# Patient Record
Sex: Male | Born: 1975 | Race: White | Hispanic: No | Marital: Single | Smoking: Former smoker
Health system: Southern US, Community
[De-identification: ages and names within clinical notes are randomized; demographics above are authoritative.]

## PROBLEM LIST (undated history)

## (undated) DIAGNOSIS — Z8619 Personal history of other infectious and parasitic diseases: Secondary | ICD-10-CM

## (undated) DIAGNOSIS — Z87442 Personal history of urinary calculi: Secondary | ICD-10-CM

## (undated) HISTORY — DX: Personal history of other infectious and parasitic diseases: Z86.19

## (undated) HISTORY — DX: Personal history of urinary calculi: Z87.442

## (undated) HISTORY — PX: TONSILLECTOMY: SHX5217

---

## 2010-05-19 ENCOUNTER — Emergency Department (INDEPENDENT_AMBULATORY_CARE_PROVIDER_SITE_OTHER)
Admission: EM | Admit: 2010-05-19 | Discharge: 2010-05-19 | Payer: PRIVATE HEALTH INSURANCE | Source: Home / Self Care | Admitting: Emergency Medicine

## 2010-05-19 DIAGNOSIS — M549 Dorsalgia, unspecified: Secondary | ICD-10-CM

## 2010-05-19 DIAGNOSIS — Z87442 Personal history of urinary calculi: Secondary | ICD-10-CM

## 2010-05-19 DIAGNOSIS — N509 Disorder of male genital organs, unspecified: Secondary | ICD-10-CM

## 2010-05-19 LAB — URINALYSIS, ROUTINE W REFLEX MICROSCOPIC
Nitrite: NEGATIVE
Urine Glucose, Fasting: NEGATIVE mg/dL
pH: 6 (ref 5.0–8.0)

## 2010-05-26 ENCOUNTER — Encounter: Payer: Self-pay | Admitting: Family

## 2010-05-26 ENCOUNTER — Ambulatory Visit (INDEPENDENT_AMBULATORY_CARE_PROVIDER_SITE_OTHER): Payer: PRIVATE HEALTH INSURANCE | Admitting: Family

## 2010-05-26 ENCOUNTER — Telehealth: Payer: Self-pay | Admitting: Family

## 2010-05-26 DIAGNOSIS — N410 Acute prostatitis: Secondary | ICD-10-CM

## 2010-05-26 DIAGNOSIS — R109 Unspecified abdominal pain: Secondary | ICD-10-CM

## 2010-05-26 LAB — CONVERTED CEMR LAB
Blood in Urine, dipstick: NEGATIVE
Glucose, Urine, Semiquant: NEGATIVE
Ketones, urine, test strip: NEGATIVE
Specific Gravity, Urine: 1.01
WBC Urine, dipstick: NEGATIVE
pH: 7.5

## 2010-05-27 ENCOUNTER — Encounter: Payer: Self-pay | Admitting: Family

## 2010-05-27 LAB — CONVERTED CEMR LAB
Chlamydia, Swab/Urine, PCR: NEGATIVE
GC Probe Amp, Urine: NEGATIVE

## 2010-06-04 NOTE — Progress Notes (Signed)
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 Wants to know if he can get a refill on Oxycodone (has taken all that the ED provided) or something for pain? Has taken 1 Mobic today and doesn't feel much relief in his pain.  Please advise.  Initial call taken by: Mervin Kung CMA Duncan Dull),  May 26, 2010 3:32 PM  Follow-up for Phone Call        No refill on oxycodone.  At this time, I believe that his symptoms are either due to prostatitis or low back strain.  Narcotics are not recommended in the treatment of either of these.  He should continue Mobic.  If no improvement in the next few days, he should let us know and we will  complete a CT.   Follow-up by: Lemont Fillers FNP,  May 26, 2010 3:40 PM  Additional Follow-up for Phone Call Additional follow up Details #1::        Pt advised. Additional Follow-up by: Mervin Kung CMA Duncan Dull),  May 26, 2010 4:13 PM

## 2010-06-04 NOTE — Miscellaneous (Signed)
  Clinical Lists Changes  Medications: Removed medication of IBUPROFEN 800 MG TABS (IBUPROFEN) Take 2 tablets every 6 hours as needed.

## 2010-06-04 NOTE — Letter (Signed)
   [place] at [facility] [address] [place], [place]  [address]  [place] Phone: [phone]      May 27, 2010   [name] [address] [place], [place] [address]  RE:  LAB RESULTS  Dear  [name],  The following is an interpretation of your most recent lab tests.  Please take note of any instructions provided or changes to medications that have resulted from your lab work. Your PSA (prostate test) is normal.     Sincerely Yours,    Lemont Fillers FNP  Appended Document:  mailed

## 2010-06-04 NOTE — Assessment & Plan Note (Signed)
Summary: new to est/kidney stones/medcost/seen in ed/ss--rm 5   Vital Signs:  Patient profile:   35 year old male Height:      67.75 inches Weight:      199.75 pounds BMI:     30.71 Temp:     98.4 degrees F oral Pulse rate:   78 / minute Pulse rhythm:   regular Resp:     16 per minute BP sitting:   110 / 76  (right arm) Cuff size:   large  Vitals Entered By: Mervin Kung CMA Duncan Dull) (May 26, 2010 10:05 AM) CC: Pt states he has had pain in left lower back and left testacle x 1 week. Was seen in ED and told he may have a stone. Pt thinks he has pulled a muscle. Is Patient Diabetic? No Pain Assessment Patient in pain? yes     Location: left lower back   CC:  Pt states he has had pain in left lower back and left testacle x 1 week. Was seen in ED and told he may have a stone. Pt thinks he has pulled a muscle.Marland Kitchen  History of Present Illness: [name] is a 35 year old male who presents today with complaint of  left lower back pain which started a week ago Sunday.  Pain radiates into his left testicle.    Pain is worsened by nothing. Pain is improved by certain positions.   Pain is described as a discomfort and is moderate in severity.   Pain is associated with radiation into the left testicle. Pain is not associated with fever or hematuria.  Feels that this is different from the pain that he has experienced previously with kidney stones.     Hurts to bend over or to to put socks on.  "Feels like I am sitting on my testicle." Denies problems with erectile dysfunction.  Patient was seen in the ED on 1/31 for same and underwent a scotal ultrasound, and UA.   Preventive Screening-Counseling & Management  Alcohol-Tobacco     Alcohol drinks/day: on weekends     Alcohol type: beer, liquor     Smoking Status: quit     Year Quit: 2006     Pack years: 12  Caffeine-Diet-Exercise     Caffeine use/day: drinks tea all day, 1 energy drink daily     Does Patient Exercise: no   Drug Use:  no.    Allergies (verified): 1)  ! Pcn  Past History:  Past Medical History: Hx of chicken pox Hx of kidney stone  Past Surgical History: tonsillectomy as a child  Family History: Reviewed history and no changes required. Mother-- living; lymph nodes removed from breast, uterine tumor? Father-- living- alive and well PGF--deceased; pneumonia, lung cancer PGM-- living; stroke, cardiac stent ? valve replacement 2 sisters-- a & w No children  Social History: Reviewed history and no changes required. Occupation: truck Hospital doctor  Single Tobacco--quit 2006 Alcohol use-yes- weekends few glasses beer or liquor Drug use-no; quit 2009 previous use of marijuana pain pills cocaine.  Regular exercise-no Smoking Status:  quit Caffeine use/day:  drinks tea all day, 1 energy drink daily Does Patient Exercise:  no Drug Use:  no  Review of Systems       Constitutional: Denies Fever ENT: mild nasal congestion, denies sore throat. Resp: mild cough associated with sinus drainge CV:  Denies Chest Pain or shortness of breath GI:  Denies nausea or vomitting, chronic diarrhea GU: Denies dysuria, denies hematuria Lymphatic:  Denies lymphadenopathy Musculoskeletal:  Denies muscle/joint pain, denies recent back injury Skin:  Denies Rashes has mole on back Psychiatric: Denies depression or anxiety Neuro: Denies numbness or weakness.     Physical Exam  General:  Well-developed,well-nourished,in no acute distress; alert,appropriate and cooperative throughout examination Head:  Normocephalic and atraumatic without obvious abnormalities. No apparent alopecia or balding. Eyes:  PERRLA, sclera are clear Ears:  External ear exam shows no significant lesions or deformities.  Otoscopic examination reveals clear canals, tympanic membranes are intact bilaterally without bulging, retraction, inflammation or discharge. Hearing is grossly normal bilaterally. Mouth:  Oral mucosa and oropharynx  without lesions or exudates.  Teeth in good repair. Neck:  No deformities, masses, or tenderness noted. No thyroid enlargement Lungs:  Normal respiratory effort, chest expands symmetrically. Lungs are clear to auscultation, no crackles or wheezes. Heart:  Normal rate and regular rhythm. S1 and S2 normal without gallop, murmur, click, rub or other extra sounds. Abdomen:  Bowel sounds positive,abdomen soft and non-tender without masses, organomegaly or hernias noted. Rectal:  Prominent prostate.  Non-tender to palpation, smooth.no external abnormalities and no hemorrhoids.   Genitalia:  No penis lesions or urethral discharge. No palpable inguinal hernias on exam.circumcised.   Prostate:  Prominent prostate, ? slightly enlarged. Non-tender to palpation.  Smooth and symmetric without palpable nodules.  Msk:  normal ROM, no joint tenderness, and no joint swelling.   Extremities:  No peripheral edema Neurologic:  bilateral LE strength is 5/5alert & oriented X3, gait normal, and DTRs symmetrical and normal.   Psych:  Cognition and judgment appear intact. Alert and cooperative with normal attention span and concentration. No apparent delusions, illusions, hallucinations   Detailed Back/Spine Exam  Lumbosacral Exam:  Inspection-deformity:    Normal Palpation-spinal tenderness:  Normal Lying Straight Leg Raise:    Right:  negative    Left:  negative   Impression & Recommendations:  Problem # 1:  GROIN PAIN (ICD-789.09) Assessment New ED records were reviewed.  Differential includes Acute prostatitis, inguinal hernia, musculoskeletal strain and kidney stone.  No palpable inguinal hernia on exam today and no blood in urinalysis which makes kidney stone and hernia less likely.  Prostate seems slightly enlarged for age.  Will plan to treat empirically for prostatitis with cipro.(Doxycycline rx was cancelled at pharmacy)  Pt will also be given mobic as needed for pain.  Send urine for GC/Clamydia and  culture.  Follow up in 2 weeks.      His updated medication list for this problem includes:    Oxycodone-acetaminophen 5-500 Mg Caps (Oxycodone-acetaminophen) .Marland Kitchen... 2 tablets every 5 hours as needed.    Ibuprofen 800 Mg Tabs (Ibuprofen) .Marland Kitchen... Take 2 tablets every 6 hours as needed.    Mobic 7.5 Mg Tabs (Meloxicam) ..... One tablet by mouth daily as needed for back pain  Complete Medication List: 1)  Oxycodone-acetaminophen 5-500 Mg Caps (Oxycodone-acetaminophen) .... 2 tablets every 5 hours as needed. 2)  Ibuprofen 800 Mg Tabs (Ibuprofen) .... Take 2 tablets every 6 hours as needed. 3)  Cipro 500 Mg Tabs (Ciprofloxacin hcl) .... One tablet by mouth two times a day for 14 days 4)  Mobic 7.5 Mg Tabs (Meloxicam) .... One tablet by mouth daily as needed for back pain  Other Orders: T-PSA ([id number]) UA Dipstick w/o Micro (manual) (14782) T-Culture, Urine ([id number]) T-Chlamydia & GC Probe, Urine (87491/[id number]) Specimen Handling (99000)  Patient Instructions: 1)  Call if you develop fever, worsening low back pain, nausea, vomitting. 2)  Follow up in  2 weeks. Prescriptions: MOBIC 7.5 MG TABS (MELOXICAM) one tablet by mouth daily as needed for back pain  #20 x 0   Entered and Authorized by:   Lemont Fillers FNP   Signed by:   Lemont Fillers FNP on 05/26/2010   Method used:   Electronically to        [facility]  [address] [phone]* (retail)       [address]       [place], [place]  [address]       Ph: [phone] or [phone]       Fax: [phone]   RxID:   [id number] CIPRO 500 MG TABS (CIPROFLOXACIN HCL) one tablet by mouth two times a day for 14 days  #28 x 0   Entered and Authorized by:   Lemont Fillers FNP   Signed by:   Lemont Fillers FNP on 05/26/2010   Method used:   Electronically to        [facility]  [address] [phone]* (retail)       [address]       [place], [place]  [address]       Ph:  [phone] or [phone]       Fax: [phone]   RxID:   [id number] DOXYCYCLINE HYCLATE 100 MG CAPS (DOXYCYCLINE HYCLATE) one tablet by mouth two times a day for 10 days  #20 x 0   Entered and Authorized by:   Lemont Fillers FNP   Signed by:   Lemont Fillers FNP on 05/26/2010   Method used:   [facility]        [facility]  [address] [phone]* (retail)       [address]       [place], [place]  [address]       Ph: [phone] or [phone]       Fax: [phone]   RxID:   [id number]    Orders Added: 1)  T-PSA [id number] 2)  UA Dipstick w/o Micro (manual) [81002] 3)  T-Culture, Urine [[id number]] 4)  T-Chlamydia & GC Probe, Urine [87491/[id number]] 5)  Specimen Handling [99000] 6)  New Patient Level IV [34193]     Preventive Care Screening  Last Tetanus Booster:    Date:  04/20/2007    Results:  history    Current Allergies (reviewed today): ! PCN  Laboratory Results   Urine Tests   Date/Time Reported: Mervin Kung CMA Duncan Dull)  May 26, 2010 10:48 AM   Routine Urinalysis   Color: yellow Appearance: Clear Glucose: negative   (Normal Range: Negative) Bilirubin: negative   (Normal Range: Negative) Ketone: negative   (Normal Range: Negative) Spec. Gravity: 1.010   (Normal Range: 1.003-1.035) Blood: negative   (Normal Range: Negative) pH: 7.5   (Normal Range: 5.0-8.0) Protein: negative   (Normal Range: Negative) Urobilinogen: 0.2   (Normal Range: 0-1) Nitrite: negative   (Normal Range: Negative) Leukocyte Esterace: negative   (Normal Range: Negative)

## 2010-06-09 ENCOUNTER — Ambulatory Visit (INDEPENDENT_AMBULATORY_CARE_PROVIDER_SITE_OTHER): Payer: PRIVATE HEALTH INSURANCE | Admitting: Internal Medicine

## 2010-06-09 ENCOUNTER — Encounter: Payer: Self-pay | Admitting: Family

## 2010-06-09 DIAGNOSIS — R109 Unspecified abdominal pain: Secondary | ICD-10-CM

## 2010-06-16 NOTE — Assessment & Plan Note (Signed)
Summary: 2 wk follow up/ss   Vital Signs:  Patient profile:   35 year old male Height:      67.5 inches Weight:      201.50 pounds BMI:     31.21 O2 Sat:      98 % on Room air Temp:     98.3 degrees F oral Pulse rate:   78 / minute Resp:     20 per minute BP sitting:   100 / 66  (right arm) Cuff size:   large  Vitals Entered By: Glendell Docker CMA (June 09, 2010 8:54 AM)  O2 Flow:  Room air CC: 2 week follow up  Is Patient Diabetic? No Pain Assessment Patient in pain? no      Comments symptoms resolved, concerned with exposure to illness and would like to know if current antibiotics will help with that   CC:  2 week follow up .  History of Present Illness: [name] is a 35 year old male who presents today for follow up of his groin pain.  Since last visit he has continued the cipro for suspected prostatitis.  Notes that he had some mild pain on saturday, but that pain has completely resolved at this time.    Notes that he has been around several people with colds.  He himself denies associate URI symptoms.    Preventive Screening-Counseling & Management  Alcohol-Tobacco     Smoking Status: quit  Allergies: 1)  ! Pcn  Family History: Mother-- living; lymph nodes removed from breast, uterine tumor? Father-- living- alive and well PGF--deceased; pneumonia, lung cancer PGM-- living; stroke, cardiac stent ? valve replacement 2 sisters-- a & w No children Maternal GF- prostate cancer  Review of Systems       see HPI  Physical Exam  General:  Well-developed,well-nourished,in no acute distress; alert,appropriate and cooperative throughout examination Head:  + scar on top of head from childhood accident Ears:  + earings Lungs:  Normal respiratory effort, chest expands symmetrically. Lungs are clear to auscultation, no crackles or wheezes. Heart:  Normal rate and regular rhythm. S1 and S2 normal without gallop, murmur, click, rub or other extra  sounds. Psych:  Cognition and judgment appear intact. Alert and cooperative with normal attention span and concentration. No apparent delusions, illusions, hallucinations   Impression & Recommendations:  Problem # 1:  GROIN PAIN (ICD-789.09) Assessment Improved Symptoms are resolved.  Suspect that symptoms were due to prostatits.  Pt instructed to complete cipro rx.  Call if recurrent symptoms. The following medications were removed from the medication list:    Oxycodone-acetaminophen 5-500 Mg Caps (Oxycodone-acetaminophen) .Marland Kitchen... 2 tablets every 5 hours as needed. His updated medication list for this problem includes:    Mobic 7.5 Mg Tabs (Meloxicam) ..... One tablet by mouth daily as needed for back pain  Complete Medication List: 1)  Cipro 500 Mg Tabs (Ciprofloxacin hcl) .... One tablet by mouth two times a day for 14 days 2)  Mobic 7.5 Mg Tabs (Meloxicam) .... One tablet by mouth daily as needed for back pain  Patient Instructions: 1)  Please schedule a complete physical. 2)  Come fasting to this appointment.   Orders Added: 1)  Est. Patient Level III [54098]   Immunization History:  Influenza Immunization History:    Influenza:  declined (06/09/2010)   Immunization History:  Influenza Immunization History:    Influenza:  Declined (06/09/2010)  Current Allergies (reviewed today): ! PCN

## 2010-06-22 ENCOUNTER — Encounter: Payer: Self-pay | Admitting: Family

## 2010-06-22 LAB — CONVERTED CEMR LAB
ALT: 44 units/L (ref 0–53)
AST: 24 units/L (ref 0–37)
Albumin: 4.4 g/dL (ref 3.5–5.2)
Alkaline Phosphatase: 65 units/L (ref 39–117)
Basophils Absolute: 0.1 10*3/uL (ref 0.0–0.1)
Basophils Relative: 1 % (ref 0–1)
CO2: 24 meq/L (ref 19–32)
Calcium: 9.5 mg/dL (ref 8.4–10.5)
Cholesterol: 180 mg/dL (ref 0–200)
Creatinine, Ser: 1 mg/dL (ref 0.40–1.50)
Eosinophils Relative: 5 % (ref 0–5)
HCT: 46.2 % (ref 39.0–52.0)
HDL: 46 mg/dL (ref >39)
Lymphocytes Relative: 23 % (ref 12–46)
MCHC: 34.4 g/dL (ref 30.0–36.0)
Monocytes Absolute: 0.4 10*3/uL (ref 0.1–1.0)
Platelets: 179 10*3/uL (ref 150–400)
RDW: 13.1 % (ref 11.5–15.5)
Sodium: 140 meq/L (ref 135–145)
TSH: 1.35 microintl units/mL (ref 0.350–4.500)
Total Bilirubin: 0.8 mg/dL (ref 0.3–1.2)
Total CHOL/HDL Ratio: 3.9
Total Protein: 6.7 g/dL (ref 6.0–8.3)
Triglycerides: 104 mg/dL (ref <150)

## 2010-06-29 ENCOUNTER — Encounter: Payer: Self-pay | Admitting: Family

## 2010-06-29 ENCOUNTER — Encounter (INDEPENDENT_AMBULATORY_CARE_PROVIDER_SITE_OTHER): Payer: PRIVATE HEALTH INSURANCE | Admitting: Family

## 2010-06-29 DIAGNOSIS — Z0000 Encounter for general adult medical examination without abnormal findings: Secondary | ICD-10-CM

## 2010-06-29 DIAGNOSIS — R7309 Other abnormal glucose: Secondary | ICD-10-CM

## 2010-06-30 NOTE — Miscellaneous (Signed)
Summary: Orders Update  Clinical Lists Changes  Orders: Added new Test order of T-Basic Metabolic Panel ([id number]) - Signed Added new Test order of T-Hepatic Function (80076-22960) - Signed Added new Test order of T-Lipid Profile ([id number]) - Signed Added new Test order of T-TSH ([id number]) - Signed Added new Test order of T-CBC w/Diff (85025-10010) - Signed 

## 2010-07-07 ENCOUNTER — Other Ambulatory Visit: Payer: Self-pay | Admitting: Family

## 2010-07-07 NOTE — Assessment & Plan Note (Signed)
Summary: CPE/MHF--rm 5   Vital Signs:  Patient profile:   35 year old male Height:      67.5 inches Weight:      207.50 pounds BMI:     32.14 Temp:     98.1 degrees F oral Pulse rate:   78 / minute Pulse rhythm:   regular Resp:     18 per minute BP sitting:   118 / 76  (right arm) Cuff size:   large  Vitals Entered By: Mervin Kung CMA Duncan Dull) (June 29, 2010 9:04 AM) Is Patient Diabetic? No Pain Assessment Patient in pain? no      Comments Pt has completed Mobic x 2 weeks; pain is resolved. Pt completed Cipro. Nicki Guadalajara Fergerson CMA Duncan Dull)  June 29, 2010 9:09 AM    History of Present Illness: Mr.  [name] is a 35 year old male who presents today for CPX.  Preventative-  Not exercising regularly.  Diet-  eats a lot of fast food.  Drinks 2 sweet teas with each meal and "a couple of energy drinks each day."  Notes + fatigue, though thinks that he sleeps well at night.  Preventive Screening-Counseling & Management  Alcohol-Tobacco     Alcohol drinks/day: on weekends     Alcohol type: beer, liquor     Smoking Status: quit     Year Quit: 2006     Pack years: 12  Caffeine-Diet-Exercise     Caffeine use/day: drinks tea all day, 1 energy drink daily     Does Patient Exercise: no  Allergies: 1)  ! Pcn  Past History:  Past Medical History: Last updated: 05/26/2010 Hx of chicken pox Hx of kidney stone  Past Surgical History: Last updated: 05/26/2010 tonsillectomy as a child  Family History: Mother-- living; lymph nodes removed from breast, uterine tumor? Father-- living- alive and well PGF--deceased; pneumonia, lung cancer PGM-- living; stroke, cardiac stent ? valve replacement MGF--prostate cancer- diagnosed in his 50's 2 sisters-- a & w No children Maternal GF- prostate cancer  Review of Systems       Constitutional: Denies Fever ENT:  Denies nasal congestion or sore throat. Resp: Denies cough CV:  Denies Chest Pain or shortness of breath GI:  Denies  nausea or vomitting GU: Denies dysuria Lymphatic: Denies lymphadenopathy Musculoskeletal:  Denies muscle/joint pain Skin:  Denies Rashes,  mild sunburn on head Psychiatric: Denies depression or anxiety Neuro: Denies numbness     Physical Exam  General:  Well-developed,well-nourished,in no acute distress; alert,appropriate and cooperative throughout examination Head:  bald, scar noted on scalp. Eyes:  PERRLA, sclera clear Ears:  External ear exam shows no significant lesions or deformities.  Otoscopic examination reveals clear canals, tympanic membranes are intact bilaterally without bulging, retraction, inflammation or discharge. Hearing is grossly normal bilaterally.ear piercing(s) noted.   Mouth:  Oral mucosa and oropharynx without lesions or exudates.   Neck:  No deformities, masses, or tenderness noted. Chest Wall:  No deformities, masses, tenderness or gynecomastia noted. Lungs:  Normal respiratory effort, chest expands symmetrically. Lungs are clear to auscultation, no crackles or wheezes. Heart:  Normal rate and regular rhythm. S1 and S2 normal without gallop, murmur, click, rub or other extra sounds. Abdomen:  Bowel sounds positive,abdomen soft and non-tender without masses, organomegaly or hernias noted. Genitalia:  deferred- completed on recent visit. Msk:  No deformity or scoliosis noted of thoracic or lumbar spine.   Pulses:  2+ DP/PT pulses bilaterally Extremities:  No clubbing, cyanosis, edema, or deformity noted with  normal full range of motion of all joints.   Neurologic:  No cranial nerve deficits noted. Station and gait are normal. Plantar reflexes are down-going bilaterally. DTRs are symmetrical throughout. Sensory, motor and coordinative functions appear intact. Skin:  Intact without suspicious lesions or rashes Cervical Nodes:  No lymphadenopathy noted Psych:  Cognition and judgment appear intact. Alert and cooperative with normal attention span and concentration.  No apparent delusions, illusions, hallucinations   Impression & Recommendations:  Problem # 1:  PREVENTIVE HEALTH CARE (ICD-V70.0) Assessment Comment Only Patient was counseled on healthy eating, diet, exercise and weight loss.  Specifically instructed patient to eliminate sugared beverages (sweet tea/energy drinks) and substitute water.  Cut caffeine down to no more than 2 caffeinated beverages/day.  Increase fresh fruits/veggies, exercise.  Suspect that his fatigue is related to all of his sugar/caffeine consumption.  Pt to f/u in 3 months, sooner if symptoms worsen.  Labs reviewed,  no anemia or hypothyroidism.    Problem # 2:  HYPERGLYCEMIA, FASTING (ICD-790.29) Assessment: New Mild fasting hyperglycemia.  Will check A1C.   Orders: T-Hgb A1C (40981-19147)  Patient Instructions: 1)  Please complete your lab work on the first floor.   2)  Try to eliminate sugar drinks and cut your caffeine back to no more than 2 beverages a day. 3)  Eat more fresh fruits and veggies. 4)  It is important that you exercise regularly at least 20 minutes 5 times a week. If you develop chest pain, have severe difficulty breathing, or feel very tired , stop exercising immediately and seek medical attention. 5)  Please schedule a follow-up appointment in 3 months.   Orders Added: 1)  T-Hgb A1C [83036-23375] 2)  Est. Patient 18-39 years [99395] 3)  Est. Patient Level III [82956]    Current Allergies (reviewed today): ! PCN   Vital Signs:  Patient Profile:   35 year old male Height:     67.5 inches Weight:      207.50 pounds BMI:     32.14 Temp:     98.1 degrees F oral Pulse rate:   78 / minute Pulse rhythm:   regular Resp:     18 per minute BP sitting:   118 / 76 Cuff size:   large

## 2010-07-08 ENCOUNTER — Encounter: Payer: Self-pay | Admitting: Family

## 2010-07-16 NOTE — Letter (Signed)
   [place] at [facility] [address] [place], [place]  [address]  [place] Phone: [phone]      July 08, 2010   [name] [address] [place], [place] [address]  RE:  LAB RESULTS  Dear  [name],  The following is an interpretation of your most recent lab tests.  Please take note of any instructions provided or changes to medications that have resulted from your lab work.    DIABETIC STUDIES:  Excellent - no changes needed Blood Glucose: 103   HgbA1C: 5.4    Your A1C, the test used to measure diabetes is normal.    Sincerely Yours,    Lemont Fillers FNP

## 2010-09-10 ENCOUNTER — Encounter: Payer: Self-pay | Admitting: Family

## 2010-09-16 ENCOUNTER — Encounter: Payer: Self-pay | Admitting: Family

## 2010-09-21 ENCOUNTER — Ambulatory Visit: Payer: PRIVATE HEALTH INSURANCE | Admitting: Family

## 2010-09-21 DIAGNOSIS — Z0289 Encounter for other administrative examinations: Secondary | ICD-10-CM

## 2014-08-11 ENCOUNTER — Emergency Department (HOSPITAL_COMMUNITY): Payer: BLUE CROSS/BLUE SHIELD

## 2014-08-11 ENCOUNTER — Encounter (HOSPITAL_COMMUNITY): Payer: Self-pay | Admitting: Emergency Medicine

## 2014-08-11 ENCOUNTER — Inpatient Hospital Stay (HOSPITAL_COMMUNITY)
Admission: EM | Admit: 2014-08-11 | Discharge: 2014-08-19 | DRG: 536 | Disposition: A | Discharge status: Rehabilitation Facility | Payer: BLUE CROSS/BLUE SHIELD | Attending: Orthopedic Surgery | Admitting: Orthopedic Surgery

## 2014-08-11 DIAGNOSIS — S32432A Displaced fracture of anterior column [iliopubic] of left acetabulum, initial encounter for closed fracture: Secondary | ICD-10-CM | POA: Diagnosis not present

## 2014-08-11 DIAGNOSIS — T148 Other injury of unspecified body region: Secondary | ICD-10-CM | POA: Diagnosis not present

## 2014-08-11 DIAGNOSIS — N508 Other specified disorders of male genital organs: Secondary | ICD-10-CM | POA: Diagnosis not present

## 2014-08-11 DIAGNOSIS — Z09 Encounter for follow-up examination after completed treatment for conditions other than malignant neoplasm: Secondary | ICD-10-CM

## 2014-08-11 DIAGNOSIS — D62 Acute posthemorrhagic anemia: Secondary | ICD-10-CM | POA: Diagnosis not present

## 2014-08-11 DIAGNOSIS — E349 Endocrine disorder, unspecified: Secondary | ICD-10-CM | POA: Diagnosis present

## 2014-08-11 DIAGNOSIS — F17210 Nicotine dependence, cigarettes, uncomplicated: Secondary | ICD-10-CM | POA: Diagnosis present

## 2014-08-11 DIAGNOSIS — E559 Vitamin D deficiency, unspecified: Secondary | ICD-10-CM | POA: Diagnosis present

## 2014-08-11 DIAGNOSIS — S32409A Unspecified fracture of unspecified acetabulum, initial encounter for closed fracture: Secondary | ICD-10-CM

## 2014-08-11 DIAGNOSIS — T148XXA Other injury of unspecified body region, initial encounter: Secondary | ICD-10-CM

## 2014-08-11 DIAGNOSIS — S329XXA Fracture of unspecified parts of lumbosacral spine and pelvis, initial encounter for closed fracture: Secondary | ICD-10-CM | POA: Diagnosis present

## 2014-08-11 DIAGNOSIS — S32442A Displaced fracture of posterior column [ilioischial] of left acetabulum, initial encounter for closed fracture: Secondary | ICD-10-CM | POA: Diagnosis present

## 2014-08-11 LAB — CBC WITH DIFFERENTIAL/PLATELET
BASOS PCT: 0 % (ref 0–1)
Basophils Absolute: 0 10*3/uL (ref 0.0–0.1)
Eosinophils Absolute: 0 10*3/uL (ref 0.0–0.7)
Eosinophils Relative: 0 % (ref 0–5)
HCT: 39.7 % (ref 39.0–52.0)
Hemoglobin: 13.4 g/dL (ref 13.0–17.0)
LYMPHS ABS: 0.7 10*3/uL (ref 0.7–4.0)
LYMPHS PCT: 5 % — AB (ref 12–46)
MCH: 28.2 pg (ref 26.0–34.0)
MCHC: 33.8 g/dL (ref 30.0–36.0)
MCV: 83.4 fL (ref 78.0–100.0)
MONO ABS: 0.9 10*3/uL (ref 0.1–1.0)
Monocytes Relative: 7 % (ref 3–12)
NEUTROS PCT: 88 % — AB (ref 43–77)
Neutro Abs: 11.9 10*3/uL — ABNORMAL HIGH (ref 1.7–7.7)
Platelets: 177 10*3/uL (ref 150–400)
RBC: 4.76 MIL/uL (ref 4.22–5.81)
RDW: 13.3 % (ref 11.5–15.5)
WBC: 13.5 10*3/uL — ABNORMAL HIGH (ref 4.0–10.5)

## 2014-08-11 LAB — COMPREHENSIVE METABOLIC PANEL
ALK PHOS: 65 U/L (ref 39–117)
ALT: 49 U/L (ref 0–53)
AST: 35 U/L (ref 0–37)
Albumin: 3.7 g/dL (ref 3.5–5.2)
Anion gap: 12 (ref 5–15)
BUN: 18 mg/dL (ref 6–23)
CO2: 21 mmol/L (ref 19–32)
CREATININE: 1.57 mg/dL — AB (ref 0.50–1.35)
Calcium: 8.8 mg/dL (ref 8.4–10.5)
Chloride: 104 mmol/L (ref 96–112)
GFR calc non Af Amer: 54 mL/min — ABNORMAL LOW (ref >90)
GFR, EST AFRICAN AMERICAN: 63 mL/min — AB (ref >90)
GLUCOSE: 168 mg/dL — AB (ref 70–99)
Potassium: 3.5 mmol/L (ref 3.5–5.1)
Sodium: 137 mmol/L (ref 135–145)
Total Bilirubin: 0.9 mg/dL (ref 0.3–1.2)
Total Protein: 6.4 g/dL (ref 6.0–8.3)

## 2014-08-11 LAB — ABO/RH: ABO/RH(D): A POS

## 2014-08-11 LAB — ETHANOL

## 2014-08-11 LAB — LACTIC ACID, PLASMA: Lactic Acid, Venous: 3.4 mmol/L (ref 0.5–2.0)

## 2014-08-11 LAB — I-STAT CG4 LACTIC ACID, ED: Lactic Acid, Venous: 1 mmol/L (ref 0.5–2.0)

## 2014-08-11 MED ORDER — HYDROMORPHONE HCL 1 MG/ML IJ SOLN
2.0000 mg | Freq: Once | INTRAMUSCULAR | Status: AC
  Administered 2014-08-11: 2 mg via INTRAVENOUS

## 2014-08-11 MED ORDER — HYDROMORPHONE HCL 1 MG/ML IJ SOLN
INTRAMUSCULAR | Status: AC
  Filled 2014-08-11: qty 2

## 2014-08-11 MED ORDER — HYDROMORPHONE HCL 1 MG/ML IJ SOLN
1.0000 mg | Freq: Once | INTRAMUSCULAR | Status: AC
  Administered 2014-08-11: 1 mg via INTRAVENOUS

## 2014-08-11 MED ORDER — BUPIVACAINE HCL (PF) 0.5 % IJ SOLN
30.0000 mL | Freq: Once | INTRAMUSCULAR | Status: AC
  Administered 2014-08-11: 30 mL
  Filled 2014-08-11: qty 30

## 2014-08-11 MED ORDER — DIPHENHYDRAMINE HCL 50 MG/ML IJ SOLN
25.0000 mg | Freq: Once | INTRAMUSCULAR | Status: AC
  Administered 2014-08-11: 25 mg via INTRAVENOUS
  Filled 2014-08-11: qty 1

## 2014-08-11 MED ORDER — HYDROMORPHONE HCL 1 MG/ML IJ SOLN
INTRAMUSCULAR | Status: AC
  Filled 2014-08-11: qty 1

## 2014-08-11 MED ORDER — HYDROMORPHONE HCL 1 MG/ML IJ SOLN
2.0000 mg | INTRAMUSCULAR | Status: AC | PRN
  Administered 2014-08-11 – 2014-08-12 (×10): 2 mg via INTRAVENOUS
  Filled 2014-08-11 (×7): qty 2

## 2014-08-11 MED ORDER — SODIUM CHLORIDE 0.9 % IV BOLUS (SEPSIS)
1000.0000 mL | Freq: Once | INTRAVENOUS | Status: AC
  Administered 2014-08-11: 1000 mL via INTRAVENOUS

## 2014-08-11 MED ORDER — HYDROMORPHONE HCL 1 MG/ML IJ SOLN
INTRAMUSCULAR | Status: AC
  Administered 2014-08-11: 2 mg via INTRAVENOUS
  Filled 2014-08-11: qty 2

## 2014-08-11 MED ORDER — ONDANSETRON HCL 4 MG/2ML IJ SOLN
INTRAMUSCULAR | Status: AC
  Filled 2014-08-11: qty 2

## 2014-08-11 MED ORDER — ONDANSETRON HCL 4 MG/2ML IJ SOLN
4.0000 mg | Freq: Once | INTRAMUSCULAR | Status: AC
  Administered 2014-08-11: 4 mg via INTRAVENOUS

## 2014-08-11 MED ORDER — IOHEXOL 300 MG/ML  SOLN
100.0000 mL | Freq: Once | INTRAMUSCULAR | Status: AC | PRN
  Administered 2014-08-11: 100 mL via INTRAVENOUS

## 2014-08-11 NOTE — ED Notes (Signed)
1 white metal pocket knife, 1 camo colored pocket knife, 2 sets of keys,  $1.75 in quarters,$0.10 in dimes, $0.05 x 2 in nickles, $0.01-- x 5 in pennies. $5 bill x1, $1 x2, given to sister--- Boneta LucksJenny. Witnessed by Nadyne CoombesKeisha Reid, RN

## 2014-08-11 NOTE — ED Notes (Signed)
Patient states that he is itching all over.

## 2014-08-11 NOTE — ED Notes (Signed)
Wallet given to mother

## 2014-08-11 NOTE — Consult Note (Signed)
Reason for Consult: Left hip pain Referring Physician: Dr. [name] is an 39 y.o. male.  HPI: [name] is a 39 year old patient who is riding horse today around 5:00 he fell off a horse and the horse fell on him. Denies any loss of consciousness. Denies any other orthopedic complaints but does report significant left hip pain. Is brought to the emergency room where CT scan was obtained demonstrated iliac wing fracture and both column acetabular fracture on the left-hand side. Patient is smoker. He works in [employer].  Past Medical History  Diagnosis Date  . History of chicken pox   . History of kidney stones     Past Surgical History  Procedure Laterality Date  . Tonsillectomy      Family History  Problem Relation Age of Onset  . Cancer Mother     ? cancer. Lymph nodes removed from breast, uterine tumor?  . Cancer Maternal Grandfather     prostate. Dx in his late 36s  . Stroke Paternal Grandmother   . Heart disease Paternal Grandmother     cardiac stent ? valve replacement  . Cancer Paternal Grandfather     lung  . Pneumonia Paternal Grandfather     Social History:  reports that he quit smoking about 10 years ago. He does not have any smokeless tobacco history on file. He reports that he drinks alcohol. He reports that he does not use illicit drugs.  Allergies:  Allergies  Allergen Reactions  . Penicillins     REACTION: childhood reaction    Medications: I have reviewed the patient's current medications.  Results for orders placed or performed during the hospital encounter of 08/11/14 (from the past 48 hour(s))  Comprehensive metabolic panel     Status: Abnormal   Collection Time: 08/11/14  5:56 PM  Result Value Ref Range   Sodium 137 135 - 145 mmol/L   Potassium 3.5 3.5 - 5.1 mmol/L   Chloride 104 96 - 112 mmol/L   CO2 21 19 - 32 mmol/L   Glucose, Bld 168 (H) 70 - 99 mg/dL   BUN 18 6 - 23 mg/dL   Creatinine, Ser 1.57 (H) 0.50 - 1.35 mg/dL    Calcium 8.8 8.4 - 10.5 mg/dL   Total Protein 6.4 6.0 - 8.3 g/dL   Albumin 3.7 3.5 - 5.2 g/dL   AST 35 0 - 37 U/L   ALT 49 0 - 53 U/L   Alkaline Phosphatase 65 39 - 117 U/L   Total Bilirubin 0.9 0.3 - 1.2 mg/dL   GFR calc non Af Amer 54 (L) >90 mL/min   GFR calc Af Amer 63 (L) >90 mL/min    Comment: (NOTE) The eGFR has been calculated using the CKD EPI equation. This calculation has not been validated in all clinical situations. eGFR's persistently <90 mL/min signify possible Chronic Kidney Disease.    Anion gap 12 5 - 15  Lactic acid, plasma     Status: Abnormal   Collection Time: 08/11/14  5:56 PM  Result Value Ref Range   Lactic Acid, Venous 3.4 (HH) 0.5 - 2.0 mmol/L    Comment: REPEATED TO VERIFY CRITICAL RESULT CALLED TO, READ BACK BY AND VERIFIED WITH: W.MUNNETT,RN 08/11/14 @1920  BY V.WILKINS   Ethanol     Status: None   Collection Time: 08/11/14  5:56 PM  Result Value Ref Range   Alcohol, Ethyl (B) <5 0 - 9 mg/dL    Comment:  LOWEST DETECTABLE LIMIT FOR SERUM ALCOHOL IS 11 mg/dL FOR MEDICAL PURPOSES ONLY   Type and screen     Status: None   Collection Time: 08/11/14  6:03 PM  Result Value Ref Range   ABO/RH(D) A POS    Antibody Screen NEG    Sample Expiration 08/14/2014   ABO/Rh     Status: None   Collection Time: 08/11/14  6:03 PM  Result Value Ref Range   ABO/RH(D) A POS   CBC with Differential     Status: Abnormal   Collection Time: 08/11/14  8:16 PM  Result Value Ref Range   WBC 13.5 (H) 4.0 - 10.5 K/uL   RBC 4.76 4.22 - 5.81 MIL/uL   Hemoglobin 13.4 13.0 - 17.0 g/dL   HCT 39.7 39.0 - 52.0 %   MCV 83.4 78.0 - 100.0 fL   MCH 28.2 26.0 - 34.0 pg   MCHC 33.8 30.0 - 36.0 g/dL   RDW 13.3 11.5 - 15.5 %   Platelets 177 150 - 400 K/uL   Neutrophils Relative % 88 (H) 43 - 77 %   Neutro Abs 11.9 (H) 1.7 - 7.7 K/uL   Lymphocytes Relative 5 (L) 12 - 46 %   Lymphs Abs 0.7 0.7 - 4.0 K/uL   Monocytes Relative 7 3 - 12 %   Monocytes Absolute 0.9 0.1 - 1.0  K/uL   Eosinophils Relative 0 0 - 5 %   Eosinophils Absolute 0.0 0.0 - 0.7 K/uL   Basophils Relative 0 0 - 1 %   Basophils Absolute 0.0 0.0 - 0.1 K/uL  I-Stat CG4 Lactic Acid, ED     Status: None   Collection Time: 08/11/14  9:02 PM  Result Value Ref Range   Lactic Acid, Venous 1.00 0.5 - 2.0 mmol/L    Ct Head Wo Contrast  08/11/2014   CLINICAL DATA:  39 year old male status post fall while riding horse with horse then rolling on top of him.  EXAM: CT HEAD WITHOUT CONTRAST  CT CERVICAL SPINE WITHOUT CONTRAST  TECHNIQUE: Multidetector CT imaging of the head and cervical spine was performed following the standard protocol without intravenous contrast. Multiplanar CT image reconstructions of the cervical spine were also generated.  COMPARISON:  None.  FINDINGS: CT HEAD FINDINGS  Negative for acute intracranial hemorrhage, acute infarction, mass, mass effect, hydrocephalus or midline shift. Gray-white differentiation is preserved throughout. No focal soft tissue or calvarial abnormality. Mucoperiosteal thickening noted in the right maxillary sinus. Otherwise, the mastoid air cells and visualized paranasal sinuses are within normal limits. Globes and orbits are unremarkable.  CT CERVICAL SPINE FINDINGS  No acute fracture, malalignment or prevertebral soft tissue swelling. Unremarkable CT appearance of the thyroid gland. No acute soft tissue abnormality. The lung apices are unremarkable.  IMPRESSION: CT HEAD  1. Negative 2. 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 CT CSPINE  1. No acute fracture or malalignment.   Electronically Signed   By: Jacqulynn Cadet M.D.   On: 08/11/2014 19:32   Ct Cervical Spine Wo Contrast  08/11/2014   CLINICAL DATA:  39 year old male status post fall while riding horse with horse then rolling on top of him.  EXAM: CT HEAD WITHOUT CONTRAST  CT CERVICAL SPINE WITHOUT CONTRAST  TECHNIQUE: Multidetector CT imaging of the head and cervical spine was  performed following the standard protocol without intravenous contrast. Multiplanar CT image reconstructions of the cervical spine were also generated.  COMPARISON:  None.  FINDINGS: CT HEAD FINDINGS  Negative for acute intracranial hemorrhage, acute infarction, mass, mass effect, hydrocephalus or midline shift. Gray-white differentiation is preserved throughout. No focal soft tissue or calvarial abnormality. Mucoperiosteal thickening noted in the right maxillary sinus. Otherwise, the mastoid air cells and visualized paranasal sinuses are within normal limits. Globes and orbits are unremarkable.  CT CERVICAL SPINE FINDINGS  No acute fracture, malalignment or prevertebral soft tissue swelling. Unremarkable CT appearance of the thyroid gland. No acute soft tissue abnormality. The lung apices are unremarkable.  IMPRESSION: CT HEAD  1. Negative 2. 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 CT CSPINE  1. No acute fracture or malalignment.   Electronically Signed   By: Jacqulynn Cadet M.D.   On: 08/11/2014 19:32   Ct Abdomen Pelvis W Contrast  08/11/2014   CLINICAL DATA:  [place] from horse.  Horse rolled on  top of patient.  EXAM: CT ABDOMEN AND PELVIS WITH CONTRAST  TECHNIQUE: Multidetector CT imaging of the abdomen and pelvis was performed using the standard protocol following bolus administration of intravenous contrast.  CONTRAST:  100 mL Omnipaque 300 IV  COMPARISON:  Pelvis radiograph from today  FINDINGS: Lung bases are clear. No infiltrate or effusion. No lower rib fracture identified.  Liver and spleen are normal without evidence of injury or hematoma. Pancreas is normal without edema or mass. Gallbladder and bile ducts normal.  Kidneys are normal.  No renal mass or obstruction or stone.  Negative for bowel obstruction or bowel thickening. No bowel hematoma. Appendix is normal.  Fracture of the left iliac bone with angulation and displacement. Fracture extends from the iliac crest  into the acetabulum. The roof of the acetabulum is displaced laterally approximately 13 mm. Femoral head is displaced laterally from the medial acetabulum and is in normal alignment with the roof of the acetabulum. No femoral neck fracture is identified. SI joint is intact. Iliac bone below the SI joint is displaced medially into the pelvis approximately 15 mm. Nondisplaced fracture of the inferior pubic ramus best seen on axial images. No fracture of the lumbar spine. Disc degeneration and spurring at L5-S1.  Left iliac artery and vein appear patent. There is mild extraperitoneal hematoma on the left around the iliac vessels and lateral to the bladder. Bladder is mildly displaced to the right due to hematoma.  IMPRESSION: No internal injury in the upper abdomen.  Displaced fracture the iliac bone extending from the iliac crest into the acetabulum. The iliac fragment is displaced laterally. Femoral head also displaced laterally. Nondisplaced fracture left inferior pubic ramus. Mild extraperitoneal hematoma on the left likely due to venous bleeding.   Electronically Signed   By: Franchot Gallo M.D.   On: 08/11/2014 19:46   Dg Pelvis Portable  08/11/2014   CLINICAL DATA:  Fall  EXAM: PORTABLE PELVIS 1-2 VIEWS  COMPARISON:  None.  FINDINGS: Vertical fracture through the iliac bone extending from the iliac crest into the acetabulum. Fracture shows moderate displacement. Large fragment fracture fragment is displaced laterally and superiorly. No dislocation of the hip joint. SI joint intact. No other fracture.  IMPRESSION: Displaced fracture of the left iliac bone extending into the left hip joint. Fracture is displaced superiorly and laterally. CT recommended for further evaluation.   Electronically Signed   By: Franchot Gallo M.D.   On: 08/11/2014 18:33   Dg Chest Portable 1 View  08/11/2014   CLINICAL DATA:  39 year old male status post horse related injury. Per report, a horse fell on the patient.  EXAM:  PORTABLE CHEST - 1 VIEW  COMPARISON:  None.  FINDINGS: Low inspiratory volumes. The lungs are otherwise clear. Metallic artifact overlying the right chest consistent with personal adornment. No acute fracture.  IMPRESSION: No active disease.   Electronically Signed   By: Jacqulynn Cadet M.D.   On: 08/11/2014 18:31   Dg Femur Port 1v Left  08/11/2014   CLINICAL DATA:  Crush injury of the leg.  Horse fell on patient.  EXAM: LEFT FEMUR PORTABLE 1 VIEW  COMPARISON:  None.  FINDINGS: Single views of the proximal and distal femur are present. Nonstandard projections. The visible proximal femur appears intact. Distal femur are also appears intact.  IMPRESSION: Negative.   Electronically Signed   By: Dereck Ligas M.D.   On: 08/11/2014 18:43   Dg Femur Min 2 Views Left  08/11/2014   CLINICAL DATA:  39 year old male with pelvic crush injury secondary to or horseback riding accident. Pain extends down the 5. Evaluate for femoral injury.  EXAM: LEFT FEMUR 2 VIEWS  COMPARISON:  Concurrent scratch then CT scan of the abdomen and pelvis performed earlier today  FINDINGS: Incompletely imaged multifocal pelvic fractures. Nondisplaced left inferior pubic ramus fracture. Incompletely imaged angulated fracture through the left iliac wing with involvement of the superior acetabulum.  The femur itself is intact. The femoral head remains located. The knee joint is unremarkable.  IMPRESSION: 1. No evidence of femoral fracture or malalignment. 2. Incompletely imaged pelvic fractures including left iliac wing, superior acetabulum and left inferior pubic ramus. Please see recently obtained CT scan of the abdomen and pelvis for further detail.   Electronically Signed   By: Jacqulynn Cadet M.D.   On: 08/11/2014 20:13    Review of Systems  Constitutional: Negative.   HENT: Negative.   Eyes: Negative.   Respiratory: Negative.   Cardiovascular: Negative.   Gastrointestinal: Negative.   Genitourinary: Negative.    Musculoskeletal: Positive for joint pain.  Skin: Negative.   Neurological: Negative.   Endo/Heme/Allergies: Negative.   Psychiatric/Behavioral: Negative.    Blood pressure 114/70, pulse 92, temperature 98 F (36.7 C), temperature source Oral, resp. rate 19, SpO2 95 %. Physical Exam  Constitutional: He appears well-developed.  HENT:  Head: Normocephalic.  Eyes: Pupils are equal, round, and reactive to light.  Neck: Normal range of motion.  Cardiovascular: Normal rate.   Respiratory: Effort normal.  GI: Soft.  Neurological: He is alert.  Skin: Skin is warm.  Psychiatric: He has a normal mood and affect.   examination bilateral upper extremities demonstrates good grip EPL FPL arises flexion against extension biceps triceps and deltoid function neck range of motion nontender right lower Ebony Hail he demonstrates no effusion the knee with ankle range of motion no groin pain internal extra rotation leg left lower Ebony Hail he demonstrates intact sensation dorsum plantar aspect of the foot no saddle paresthesias no knee effusion does have some bruising and ecchymosis on the left-hand side around the proximal thigh region.  Assessment/Plan: Impression is unstable pelvic injury iliac wing/both column acetabulum on the left. Plan tibial traction pin general surgery consultation even though the CT scan was negative for acute intra-abdominal pathology. Will require surgery on Tuesday by Dr. handy. Discussed the case with him tonight. tHis is a very complex acetabular injury. Risk and benefits of traction pin placement tonight discussed all questions answered plan balanced skeletal traction with evaluation in the morning by Dr. Evonnie Pat SCOTT 08/11/2014, 11:12 PM

## 2014-08-11 NOTE — ED Notes (Signed)
Family at beside. Family given emotional support. 

## 2014-08-11 NOTE — ED Notes (Signed)
Received pt via EMS with c/o fall while riding a horse, horse rolled on top of him. Pt presents with abrasions to head, left flank and left hip. Denies +LOC, neck or back pain. Pt given 10 mg of Morphine by EMS without relief.

## 2014-08-11 NOTE — ED Notes (Signed)
Patient refused foley placement at this time.  Patient offered condom cath.  Patient wanted urinal.  Patient given urinal.

## 2014-08-11 NOTE — ED Notes (Signed)
Patient continues exam in CT at change of shift.

## 2014-08-11 NOTE — ED Notes (Signed)
Pt's mother [name] [phone]

## 2014-08-11 NOTE — ED Notes (Signed)
\  [id number]\

## 2014-08-11 NOTE — ED Notes (Signed)
Patient transported to CT 

## 2014-08-12 ENCOUNTER — Inpatient Hospital Stay (HOSPITAL_COMMUNITY): Payer: BLUE CROSS/BLUE SHIELD

## 2014-08-12 DIAGNOSIS — S32402S Unspecified fracture of left acetabulum, sequela: Secondary | ICD-10-CM | POA: Diagnosis not present

## 2014-08-12 DIAGNOSIS — S3022XS Contusion of scrotum and testes, sequela: Secondary | ICD-10-CM | POA: Diagnosis not present

## 2014-08-12 DIAGNOSIS — S329XXS Fracture of unspecified parts of lumbosacral spine and pelvis, sequela: Secondary | ICD-10-CM | POA: Diagnosis not present

## 2014-08-12 DIAGNOSIS — S32432D Displaced fracture of anterior column [iliopubic] of left acetabulum, subsequent encounter for fracture with routine healing: Secondary | ICD-10-CM | POA: Diagnosis not present

## 2014-08-12 DIAGNOSIS — S32432A Displaced fracture of anterior column [iliopubic] of left acetabulum, initial encounter for closed fracture: Secondary | ICD-10-CM | POA: Diagnosis present

## 2014-08-12 DIAGNOSIS — F17210 Nicotine dependence, cigarettes, uncomplicated: Secondary | ICD-10-CM | POA: Diagnosis present

## 2014-08-12 DIAGNOSIS — D62 Acute posthemorrhagic anemia: Secondary | ICD-10-CM | POA: Diagnosis not present

## 2014-08-12 DIAGNOSIS — T148 Other injury of unspecified body region: Secondary | ICD-10-CM | POA: Diagnosis present

## 2014-08-12 DIAGNOSIS — E559 Vitamin D deficiency, unspecified: Secondary | ICD-10-CM | POA: Diagnosis present

## 2014-08-12 DIAGNOSIS — N508 Other specified disorders of male genital organs: Secondary | ICD-10-CM | POA: Diagnosis not present

## 2014-08-12 DIAGNOSIS — S32442A Displaced fracture of posterior column [ilioischial] of left acetabulum, initial encounter for closed fracture: Secondary | ICD-10-CM | POA: Diagnosis present

## 2014-08-12 DIAGNOSIS — S329XXA Fracture of unspecified parts of lumbosacral spine and pelvis, initial encounter for closed fracture: Secondary | ICD-10-CM | POA: Diagnosis present

## 2014-08-12 LAB — CBC
HEMATOCRIT: 34.9 % — AB (ref 39.0–52.0)
Hemoglobin: 11.8 g/dL — ABNORMAL LOW (ref 13.0–17.0)
MCH: 28.6 pg (ref 26.0–34.0)
MCHC: 33.8 g/dL (ref 30.0–36.0)
MCV: 84.5 fL (ref 78.0–100.0)
PLATELETS: 148 10*3/uL — AB (ref 150–400)
RBC: 4.13 MIL/uL — ABNORMAL LOW (ref 4.22–5.81)
RDW: 13.8 % (ref 11.5–15.5)
WBC: 6.2 10*3/uL (ref 4.0–10.5)

## 2014-08-12 LAB — URINALYSIS, ROUTINE W REFLEX MICROSCOPIC
Bilirubin Urine: NEGATIVE
Glucose, UA: NEGATIVE mg/dL
HGB URINE DIPSTICK: NEGATIVE
Ketones, ur: NEGATIVE mg/dL
LEUKOCYTES UA: NEGATIVE
NITRITE: NEGATIVE
PH: 5 (ref 5.0–8.0)
Protein, ur: NEGATIVE mg/dL
Specific Gravity, Urine: 1.043 — ABNORMAL HIGH (ref 1.005–1.030)
UROBILINOGEN UA: 0.2 mg/dL (ref 0.0–1.0)

## 2014-08-12 LAB — BASIC METABOLIC PANEL
Anion gap: 8 (ref 5–15)
BUN: 14 mg/dL (ref 6–23)
CALCIUM: 8.4 mg/dL (ref 8.4–10.5)
CHLORIDE: 108 mmol/L (ref 96–112)
CO2: 22 mmol/L (ref 19–32)
Creatinine, Ser: 1.16 mg/dL (ref 0.50–1.35)
GFR calc non Af Amer: 78 mL/min — ABNORMAL LOW (ref >90)
GLUCOSE: 100 mg/dL — AB (ref 70–99)
POTASSIUM: 3.8 mmol/L (ref 3.5–5.1)
Sodium: 138 mmol/L (ref 135–145)

## 2014-08-12 LAB — RAPID URINE DRUG SCREEN, HOSP PERFORMED
Amphetamines: NOT DETECTED
Barbiturates: NOT DETECTED
Benzodiazepines: NOT DETECTED
Cocaine: POSITIVE — AB
Opiates: POSITIVE — AB
Tetrahydrocannabinol: POSITIVE — AB

## 2014-08-12 MED ORDER — CLINDAMYCIN HCL 300 MG PO CAPS
300.0000 mg | ORAL_CAPSULE | Freq: Three times a day (TID) | ORAL | Status: DC
  Administered 2014-08-12 – 2014-08-13 (×3): 300 mg via ORAL
  Filled 2014-08-12 (×3): qty 1

## 2014-08-12 MED ORDER — OXYCODONE HCL 5 MG PO TABS
5.0000 mg | ORAL_TABLET | ORAL | Status: DC | PRN
  Administered 2014-08-12 – 2014-08-13 (×3): 10 mg via ORAL
  Filled 2014-08-12 (×3): qty 2

## 2014-08-12 MED ORDER — MIDAZOLAM HCL 2 MG/2ML IJ SOLN: 1.0000 mg | INTRAMUSCULAR | Status: DC | PRN

## 2014-08-12 MED ORDER — LIDOCAINE-PRILOCAINE 2.5-2.5 % EX CREA
1.0000 "application " | TOPICAL_CREAM | Freq: Once | CUTANEOUS | Status: DC
  Filled 2014-08-12: qty 5

## 2014-08-12 MED ORDER — MIDAZOLAM HCL 2 MG/2ML IJ SOLN: 0.5000 mg | INTRAMUSCULAR | Status: DC | PRN

## 2014-08-12 MED ORDER — ACETAMINOPHEN 10 MG/ML IV SOLN
1000.0000 mg | Freq: Four times a day (QID) | INTRAVENOUS | Status: AC
  Administered 2014-08-12 – 2014-08-13 (×4): 1000 mg via INTRAVENOUS
  Filled 2014-08-12 (×4): qty 100

## 2014-08-12 MED ORDER — POTASSIUM CHLORIDE IN NACL 20-0.9 MEQ/L-% IV SOLN
INTRAVENOUS | Status: DC
  Administered 2014-08-12 (×2): via INTRAVENOUS
  Filled 2014-08-12 (×3): qty 1000

## 2014-08-12 MED ORDER — CHLORHEXIDINE GLUCONATE 4 % EX LIQD
60.0000 mL | Freq: Once | CUTANEOUS | Status: AC
  Administered 2014-08-13: 4 via TOPICAL
  Filled 2014-08-12: qty 60

## 2014-08-12 MED ORDER — KETOROLAC TROMETHAMINE 30 MG/ML IJ SOLN
30.0000 mg | Freq: Three times a day (TID) | INTRAMUSCULAR | Status: DC
  Administered 2014-08-12 – 2014-08-13 (×3): 30 mg via INTRAVENOUS
  Filled 2014-08-12 (×3): qty 1

## 2014-08-12 MED ORDER — METHOCARBAMOL 1000 MG/10ML IJ SOLN
1000.0000 mg | Freq: Three times a day (TID) | INTRAVENOUS | Status: DC
  Administered 2014-08-12 – 2014-08-17 (×14): 1000 mg via INTRAVENOUS
  Filled 2014-08-12 (×19): qty 10

## 2014-08-12 MED ORDER — HYDROMORPHONE HCL 1 MG/ML IJ SOLN
INTRAMUSCULAR | Status: AC
  Filled 2014-08-12: qty 2

## 2014-08-12 MED ORDER — CLINDAMYCIN PHOSPHATE 900 MG/50ML IV SOLN
900.0000 mg | INTRAVENOUS | Status: DC
  Filled 2014-08-12: qty 50

## 2014-08-12 MED ORDER — OXYMETAZOLINE HCL 0.05 % NA SOLN
2.0000 | Freq: Once | NASAL | Status: DC
  Filled 2014-08-12: qty 15

## 2014-08-12 MED ORDER — CLINDAMYCIN PHOSPHATE 600 MG/50ML IV SOLN
600.0000 mg | INTRAVENOUS | Status: AC
  Administered 2014-08-13: 900 mg via INTRAVENOUS
  Filled 2014-08-12: qty 50

## 2014-08-12 NOTE — Op Note (Signed)
Kevin Pert:  [name], [name]                 ACCOUNT NO.:  [id number]  MEDICAL RECORD NO.:  [id number]  LOCATION:  [facility]                        FACILITY:  [facility]  PHYSICIAN:  Burnard BuntingG. Scott Dean, M.D.    DATE OF BIRTH:  [birth year]  DATE OF PROCEDURE:  08/12/2014 DATE OF DISCHARGE:                              OPERATIVE REPORT   PREOPERATIVE DIAGNOSIS:  Left acetabular fracture.  POSTOPERATIVE DIAGNOSIS:  Left acetabular fracture.  PROCEDURE:  Traction pin, left tibia.  SURGEON:  Burnard BuntingG. Scott Dean, M.D.  ASSISTANT:  None.  ANESTHESIA:  Local.  INDICATIONS:  __________ left acetabular fracture __________ skeletal traction prior to surgery.  PROCEDURE IN DETAIL:  The patient was in the __________.  Time-out was called.  Left leg prepped with ChloraPrep.  A six 2 K-wire placed about 2 fingerbreadths distal to the tibial tubercle in the anterior third of the tibial cortical crest.  Both sides were anesthetized with Marcaine. A 60 K-wire was utilized, traction bow applied, 15 pounds traction then placed.  The patient tolerated the procedure well without immediate complications, and transferred to the floor in stable condition.     Burnard BuntingG. Scott Dean, M.D.     GSD/MEDQ  D:  08/12/2014  T:  08/12/2014  Job:  [id number]

## 2014-08-12 NOTE — Consult Note (Signed)
Reason for Consult:blunt trauma to abdomen/pelvis Referring Physician: Dr. Dorene Grebe  [name] is an 39 y.o. male.  HPI: The Trauma service asked to see this patient in consultation.  Late yesterday, around 5:00, he fell off a horse which then subsequently rolled over on his abdomen and left hip area. He arrived as a level II trauma to [facility]. X-rays and CAT scans revealed a left pelvic fracture including the iliac wing. Dr. August Saucer has evaluated the patient and placed a pin and has discussed the case with Dr. Carola Frost. Trauma was asked to evaluate the patient's abdomen. It is now over 12 hours since the incident. He reports no abdominal discomfort. The CAT scan of his abdomen was unremarkable. He is passing flatus.  Past Medical History  Diagnosis Date  . History of chicken pox   . History of kidney stones     Past Surgical History  Procedure Laterality Date  . Tonsillectomy      Family History  Problem Relation Age of Onset  . Cancer Mother     ? cancer. Lymph nodes removed from breast, uterine tumor?  . Cancer Maternal Grandfather     prostate. Dx in his late 67s  . Stroke Paternal Grandmother   . Heart disease Paternal Grandmother     cardiac stent ? valve replacement  . Cancer Paternal Grandfather     lung  . Pneumonia Paternal Grandfather     Social History:  reports that he quit smoking about 10 years ago. He does not have any smokeless tobacco history on file. He reports that he drinks alcohol. He reports that he does not use illicit drugs.  Allergies:  Allergies  Allergen Reactions  . Penicillins     REACTION: childhood reaction    Medications: I have reviewed the patient's current medications.  Results for orders placed or performed during the hospital encounter of 08/11/14 (from the past 48 hour(s))  Comprehensive metabolic panel     Status: Abnormal   Collection Time: 08/11/14  5:56 PM  Result Value Ref Range   Sodium 137 135 - 145 mmol/L   Potassium 3.5  3.5 - 5.1 mmol/L   Chloride 104 96 - 112 mmol/L   CO2 21 19 - 32 mmol/L   Glucose, Bld 168 (H) 70 - 99 mg/dL   BUN 18 6 - 23 mg/dL   Creatinine, Ser 9.81 (H) 0.50 - 1.35 mg/dL   Calcium 8.8 8.4 - 09.1 mg/dL   Total Protein 6.4 6.0 - 8.3 g/dL   Albumin 3.7 3.5 - 5.2 g/dL   AST 35 0 - 37 U/L   ALT 49 0 - 53 U/L   Alkaline Phosphatase 65 39 - 117 U/L   Total Bilirubin 0.9 0.3 - 1.2 mg/dL   GFR calc non Af Amer 54 (L) >90 mL/min   GFR calc Af Amer 63 (L) >90 mL/min    Comment: (NOTE) The eGFR has been calculated using the CKD EPI equation. This calculation has not been validated in all clinical situations. eGFR's persistently <90 mL/min signify possible Chronic Kidney Disease.    Anion gap 12 5 - 15  Lactic acid, plasma     Status: Abnormal   Collection Time: 08/11/14  5:56 PM  Result Value Ref Range   Lactic Acid, Venous 3.4 (HH) 0.5 - 2.0 mmol/L    Comment: REPEATED TO VERIFY CRITICAL RESULT CALLED TO, READ BACK BY AND VERIFIED WITH: W.MUNNETT,RN 08/11/14 @1920  BY V.WILKINS   Ethanol  Status: None   Collection Time: 08/11/14  5:56 PM  Result Value Ref Range   Alcohol, Ethyl (B) <5 0 - 9 mg/dL    Comment:        LOWEST DETECTABLE LIMIT FOR SERUM ALCOHOL IS 11 mg/dL FOR MEDICAL PURPOSES ONLY   Type and screen     Status: None   Collection Time: 08/11/14  6:03 PM  Result Value Ref Range   ABO/RH(D) A POS    Antibody Screen NEG    Sample Expiration 08/14/2014   ABO/Rh     Status: None   Collection Time: 08/11/14  6:03 PM  Result Value Ref Range   ABO/RH(D) A POS   CBC with Differential     Status: Abnormal   Collection Time: 08/11/14  8:16 PM  Result Value Ref Range   WBC 13.5 (H) 4.0 - 10.5 K/uL   RBC 4.76 4.22 - 5.81 MIL/uL   Hemoglobin 13.4 13.0 - 17.0 g/dL   HCT 39.7 39.0 - 52.0 %   MCV 83.4 78.0 - 100.0 fL   MCH 28.2 26.0 - 34.0 pg   MCHC 33.8 30.0 - 36.0 g/dL   RDW 13.3 11.5 - 15.5 %   Platelets 177 150 - 400 K/uL   Neutrophils Relative % 88 (H) 43 - 77  %   Neutro Abs 11.9 (H) 1.7 - 7.7 K/uL   Lymphocytes Relative 5 (L) 12 - 46 %   Lymphs Abs 0.7 0.7 - 4.0 K/uL   Monocytes Relative 7 3 - 12 %   Monocytes Absolute 0.9 0.1 - 1.0 K/uL   Eosinophils Relative 0 0 - 5 %   Eosinophils Absolute 0.0 0.0 - 0.7 K/uL   Basophils Relative 0 0 - 1 %   Basophils Absolute 0.0 0.0 - 0.1 K/uL  I-Stat CG4 Lactic Acid, ED     Status: None   Collection Time: 08/11/14  9:02 PM  Result Value Ref Range   Lactic Acid, Venous 1.00 0.5 - 2.0 mmol/L  Urine rapid drug screen (hosp performed)     Status: Abnormal   Collection Time: 08/12/14  1:14 AM  Result Value Ref Range   Opiates POSITIVE (A) NONE DETECTED   Cocaine POSITIVE (A) NONE DETECTED   Benzodiazepines NONE DETECTED NONE DETECTED   Amphetamines NONE DETECTED NONE DETECTED   Tetrahydrocannabinol POSITIVE (A) NONE DETECTED   Barbiturates NONE DETECTED NONE DETECTED    Comment:        DRUG SCREEN FOR MEDICAL PURPOSES ONLY.  IF CONFIRMATION IS NEEDED FOR ANY PURPOSE, NOTIFY LAB WITHIN 5 DAYS.        LOWEST DETECTABLE LIMITS FOR URINE DRUG SCREEN Drug Class       Cutoff (ng/mL) Amphetamine      1000 Barbiturate      200 Benzodiazepine   680 Tricyclics       321 Opiates          300 Cocaine          300 THC              50   Urinalysis, Routine w reflex microscopic     Status: Abnormal   Collection Time: 08/12/14  1:14 AM  Result Value Ref Range   Color, Urine YELLOW YELLOW   APPearance CLEAR CLEAR   Specific Gravity, Urine 1.043 (H) 1.005 - 1.030   pH 5.0 5.0 - 8.0   Glucose, UA NEGATIVE NEGATIVE mg/dL   Hgb urine dipstick NEGATIVE NEGATIVE   Bilirubin  Urine NEGATIVE NEGATIVE   Ketones, ur NEGATIVE NEGATIVE mg/dL   Protein, ur NEGATIVE NEGATIVE mg/dL   Urobilinogen, UA 0.2 0.0 - 1.0 mg/dL   Nitrite NEGATIVE NEGATIVE   Leukocytes, UA NEGATIVE NEGATIVE    Comment: MICROSCOPIC NOT DONE ON URINES WITH NEGATIVE PROTEIN, BLOOD, LEUKOCYTES, NITRITE, OR GLUCOSE <1000 mg/dL.    Ct Head Wo  Contrast  08/11/2014   CLINICAL DATA:  39 year old male status post fall while riding horse with horse then rolling on top of him.  EXAM: CT HEAD WITHOUT CONTRAST  CT CERVICAL SPINE WITHOUT CONTRAST  TECHNIQUE: Multidetector CT imaging of the head and cervical spine was performed following the standard protocol without intravenous contrast. Multiplanar CT image reconstructions of the cervical spine were also generated.  COMPARISON:  None.  FINDINGS: CT HEAD FINDINGS  Negative for acute intracranial hemorrhage, acute infarction, mass, mass effect, hydrocephalus or midline shift. Gray-white differentiation is preserved throughout. No focal soft tissue or calvarial abnormality. Mucoperiosteal thickening noted in the right maxillary sinus. Otherwise, the mastoid air cells and visualized paranasal sinuses are within normal limits. Globes and orbits are unremarkable.  CT CERVICAL SPINE FINDINGS  No acute fracture, malalignment or prevertebral soft tissue swelling. Unremarkable CT appearance of the thyroid gland. No acute soft tissue abnormality. The lung apices are unremarkable.  IMPRESSION: CT HEAD  1. Negative 2. 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 CT CSPINE  1. No acute fracture or malalignment.   Electronically Signed   By: Jacqulynn Cadet M.D.   On: 08/11/2014 19:32   Ct Cervical Spine Wo Contrast  08/11/2014   CLINICAL DATA:  39 year old male status post fall while riding horse with horse then rolling on top of him.  EXAM: CT HEAD WITHOUT CONTRAST  CT CERVICAL SPINE WITHOUT CONTRAST  TECHNIQUE: Multidetector CT imaging of the head and cervical spine was performed following the standard protocol without intravenous contrast. Multiplanar CT image reconstructions of the cervical spine were also generated.  COMPARISON:  None.  FINDINGS: CT HEAD FINDINGS  Negative for acute intracranial hemorrhage, acute infarction, mass, mass effect, hydrocephalus or midline shift. Gray-white  differentiation is preserved throughout. No focal soft tissue or calvarial abnormality. Mucoperiosteal thickening noted in the right maxillary sinus. Otherwise, the mastoid air cells and visualized paranasal sinuses are within normal limits. Globes and orbits are unremarkable.  CT CERVICAL SPINE FINDINGS  No acute fracture, malalignment or prevertebral soft tissue swelling. Unremarkable CT appearance of the thyroid gland. No acute soft tissue abnormality. The lung apices are unremarkable.  IMPRESSION: CT HEAD  1. Negative 2. 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 CT CSPINE  1. No acute fracture or malalignment.   Electronically Signed   By: Jacqulynn Cadet M.D.   On: 08/11/2014 19:32   Ct Abdomen Pelvis W Contrast  08/11/2014   CLINICAL DATA:  [place] from horse.  Horse rolled on  top of patient.  EXAM: CT ABDOMEN AND PELVIS WITH CONTRAST  TECHNIQUE: Multidetector CT imaging of the abdomen and pelvis was performed using the standard protocol following bolus administration of intravenous contrast.  CONTRAST:  100 mL Omnipaque 300 IV  COMPARISON:  Pelvis radiograph from today  FINDINGS: Lung bases are clear. No infiltrate or effusion. No lower rib fracture identified.  Liver and spleen are normal without evidence of injury or hematoma. Pancreas is normal without edema or mass. Gallbladder and bile ducts normal.  Kidneys are normal.  No renal mass or obstruction or stone.  Negative for  bowel obstruction or bowel thickening. No bowel hematoma. Appendix is normal.  Fracture of the left iliac bone with angulation and displacement. Fracture extends from the iliac crest into the acetabulum. The roof of the acetabulum is displaced laterally approximately 13 mm. Femoral head is displaced laterally from the medial acetabulum and is in normal alignment with the roof of the acetabulum. No femoral neck fracture is identified. SI joint is intact. Iliac bone below the SI joint is displaced medially  into the pelvis approximately 15 mm. Nondisplaced fracture of the inferior pubic ramus best seen on axial images. No fracture of the lumbar spine. Disc degeneration and spurring at L5-S1.  Left iliac artery and vein appear patent. There is mild extraperitoneal hematoma on the left around the iliac vessels and lateral to the bladder. Bladder is mildly displaced to the right due to hematoma.  IMPRESSION: No internal injury in the upper abdomen.  Displaced fracture the iliac bone extending from the iliac crest into the acetabulum. The iliac fragment is displaced laterally. Femoral head also displaced laterally. Nondisplaced fracture left inferior pubic ramus. Mild extraperitoneal hematoma on the left likely due to venous bleeding.   Electronically Signed   By: Franchot Gallo M.D.   On: 08/11/2014 19:46   Dg Pelvis Portable  08/11/2014   CLINICAL DATA:  Fall  EXAM: PORTABLE PELVIS 1-2 VIEWS  COMPARISON:  None.  FINDINGS: Vertical fracture through the iliac bone extending from the iliac crest into the acetabulum. Fracture shows moderate displacement. Large fragment fracture fragment is displaced laterally and superiorly. No dislocation of the hip joint. SI joint intact. No other fracture.  IMPRESSION: Displaced fracture of the left iliac bone extending into the left hip joint. Fracture is displaced superiorly and laterally. CT recommended for further evaluation.   Electronically Signed   By: Franchot Gallo M.D.   On: 08/11/2014 18:33   Dg Chest Portable 1 View  08/11/2014   CLINICAL DATA:  39 year old male status post horse related injury. Per report, a horse fell on the patient.  EXAM: PORTABLE CHEST - 1 VIEW  COMPARISON:  None.  FINDINGS: Low inspiratory volumes. The lungs are otherwise clear. Metallic artifact overlying the right chest consistent with personal adornment. No acute fracture.  IMPRESSION: No active disease.   Electronically Signed   By: Jacqulynn Cadet M.D.   On: 08/11/2014 18:31   Dg Femur  Port 1v Left  08/11/2014   CLINICAL DATA:  Crush injury of the leg.  Horse fell on patient.  EXAM: LEFT FEMUR PORTABLE 1 VIEW  COMPARISON:  None.  FINDINGS: Single views of the proximal and distal femur are present. Nonstandard projections. The visible proximal femur appears intact. Distal femur are also appears intact.  IMPRESSION: Negative.   Electronically Signed   By: Dereck Ligas M.D.   On: 08/11/2014 18:43   Dg Femur Min 2 Views Left  08/11/2014   CLINICAL DATA:  39 year old male with pelvic crush injury secondary to or horseback riding accident. Pain extends down the 5. Evaluate for femoral injury.  EXAM: LEFT FEMUR 2 VIEWS  COMPARISON:  Concurrent scratch then CT scan of the abdomen and pelvis performed earlier today  FINDINGS: Incompletely imaged multifocal pelvic fractures. Nondisplaced left inferior pubic ramus fracture. Incompletely imaged angulated fracture through the left iliac wing with involvement of the superior acetabulum.  The femur itself is intact. The femoral head remains located. The knee joint is unremarkable.  IMPRESSION: 1. No evidence of femoral fracture or malalignment. 2. Incompletely imaged  pelvic fractures including left iliac wing, superior acetabulum and left inferior pubic ramus. Please see recently obtained CT scan of the abdomen and pelvis for further detail.   Electronically Signed   By: Jacqulynn Cadet M.D.   On: 08/11/2014 20:13    Review of Systems  All other systems reviewed and are negative.  Blood pressure 113/66, pulse 87, temperature 98.6 F (37 C), temperature source Oral, resp. rate 16, height $RemoveBe'5\' 5"'[id number]$  (1.651 m), weight 90.7 kg (199 lb 15.3 oz), SpO2 93 %. Physical Exam  Constitutional: No distress.  HENT:  Head: Atraumatic.  Neck: Neck supple. No tracheal deviation present.  Cardiovascular: Normal rate, regular rhythm and normal heart sounds.   Respiratory: Breath sounds normal. No respiratory distress.  GI: He exhibits no distension. There is no  tenderness.  His abdomen is completely soft with no guarding or tenderness except for the iliac wing on the left side  Neurological: He is alert.  Skin: Skin is dry.  Psychiatric: Judgment normal.    Assessment/Plan: Blunt trauma to the abdomen\pelvis  I reviewed the CT scan which shows no solid organ injury, thickened bowel, or free fluid.  His exam of the abdomen is also benign.  I doubt a missed bowel injury. From a general trauma standpoint, it is ok to resume a diet.  Cesar Alf A 08/12/2014, 6:30 AM

## 2014-08-12 NOTE — ED Provider Notes (Signed)
CSN: [id number]     Arrival date & time 08/11/14  1726 History   First MD Initiated Contact with Patient 08/11/14 1731     Chief Complaint  Patient presents with  . Fall  . Hip Pain  . Abrasion  . Head Injury     (Consider location/radiation/quality/duration/timing/severity/associated sxs/prior Treatment) HPI   This is a 39 year old male, with no pertinent past medical history, presenting today with pain to the pelvis. Onset prior to arrival, located left pelvis. Persistent, sharp, throbbing. Alleviated with morphine administered by paramedics. Radiating down the left lower extremity. Negative for focal weakness, numbness, tingling. Negative for headache, neck pain, back pain, chest pain, abdominal pain, nausea, or vomiting.  Mechanism of injury was crushed by a horse. Patient was riding a horse, and the horse rolled over on to the patient. The patient denies LOC, amnesia. Positive for minimal blood loss from superficial abrasions to the head.  Past Medical History  Diagnosis Date  . History of chicken pox   . History of kidney stones    Past Surgical History  Procedure Laterality Date  . Tonsillectomy     Family History  Problem Relation Age of Onset  . Cancer Mother     ? cancer. Lymph nodes removed from breast, uterine tumor?  . Cancer Maternal Grandfather     prostate. Dx in his late 26s  . Stroke Paternal Grandmother   . Heart disease Paternal Grandmother     cardiac stent ? valve replacement  . Cancer Paternal Grandfather     lung  . Pneumonia Paternal Grandfather    History  Substance Use Topics  . Smoking status: Former Smoker    Quit date: 04/19/2004  . Smokeless tobacco: Not on file  . Alcohol Use: Yes     Comment: few glasses beer or liquor on weekends    Review of Systems  Constitutional: Negative for fever and chills.  HENT: Negative for facial swelling.   Eyes: Negative for pain and visual disturbance.  Respiratory: Negative for chest tightness and  shortness of breath.   Cardiovascular: Negative for chest pain.  Gastrointestinal: Negative for nausea and vomiting.  Genitourinary: Negative for dysuria.  Musculoskeletal: Positive for arthralgias. Negative for myalgias.  Neurological: Negative for headaches.  Psychiatric/Behavioral: Negative for behavioral problems.      Allergies  Penicillins  Home Medications   Prior to Admission medications   Medication Sig Start Date End Date Taking? Authorizing Provider  benzocaine (ORAJEL) 10 % mucosal gel Use as directed 1 application in the mouth or throat daily as needed for mouth pain.   Yes Historical Provider, MD   BP 120/74 mmHg  Pulse 89  Temp(Src) 98 F (36.7 C) (Oral)  Resp 16  Ht 5\' 5"  (1.651 m)  Wt 200 lb (90.719 kg)  BMI 33.28 kg/m2  SpO2 96% Physical Exam  Constitutional: He is oriented to person, place, and time. He appears well-developed and well-nourished. No distress.  HENT:  Head: Normocephalic and atraumatic.  Mouth/Throat: No oropharyngeal exudate.  Eyes: Conjunctivae are normal. Pupils are equal, round, and reactive to light. No scleral icterus.  Neck: Normal range of motion. No tracheal deviation present. No thyromegaly present.  Cardiovascular: Normal rate, regular rhythm and normal heart sounds.  Exam reveals no gallop and no friction rub.   No murmur heard. Pulmonary/Chest: Effort normal and breath sounds normal. No stridor. No respiratory distress. He has no wheezes. He has no rales. He exhibits no tenderness.  Abdominal: Soft. He exhibits no  distension and no mass. There is no tenderness. There is no rebound and no guarding.  Musculoskeletal:       Left hip: He exhibits decreased range of motion, decreased strength, tenderness, bony tenderness, swelling, crepitus and deformity. He exhibits no laceration.       Left ankle: He exhibits normal pulse.  Neurological: He is alert and oriented to person, place, and time.  Skin: Skin is warm and dry. He is not  diaphoretic.    ED Course  Procedures (including critical care time) Labs Review Labs Reviewed  COMPREHENSIVE METABOLIC PANEL - Abnormal; Notable for the following:    Glucose, Bld 168 (*)    Creatinine, Ser 1.57 (*)    GFR calc non Af Amer 54 (*)    GFR calc Af Amer 63 (*)    All other components within normal limits  LACTIC ACID, PLASMA - Abnormal; Notable for the following:    Lactic Acid, Venous 3.4 (*)    All other components within normal limits  CBC WITH DIFFERENTIAL/PLATELET - Abnormal; Notable for the following:    WBC 13.5 (*)    Neutrophils Relative % 88 (*)    Neutro Abs 11.9 (*)    Lymphocytes Relative 5 (*)    All other components within normal limits  URINALYSIS, ROUTINE W REFLEX MICROSCOPIC - Abnormal; Notable for the following:    Specific Gravity, Urine 1.043 (*)    All other components within normal limits  ETHANOL  CBC WITH DIFFERENTIAL/PLATELET  URINE RAPID DRUG SCREEN (HOSP PERFORMED)  CBC  BASIC METABOLIC PANEL  I-STAT CG4 LACTIC ACID, ED  TYPE AND SCREEN  ABO/RH    Imaging Review Ct Head Wo Contrast  08/11/2014   CLINICAL DATA:  39 year old male status post fall while riding horse with horse then rolling on top of him.  EXAM: CT HEAD WITHOUT CONTRAST  CT CERVICAL SPINE WITHOUT CONTRAST  TECHNIQUE: Multidetector CT imaging of the head and cervical spine was performed following the standard protocol without intravenous contrast. Multiplanar CT image reconstructions of the cervical spine were also generated.  COMPARISON:  None.  FINDINGS: CT HEAD FINDINGS  Negative for acute intracranial hemorrhage, acute infarction, mass, mass effect, hydrocephalus or midline shift. Gray-white differentiation is preserved throughout. No focal soft tissue or calvarial abnormality. Mucoperiosteal thickening noted in the right maxillary sinus. Otherwise, the mastoid air cells and visualized paranasal sinuses are within normal limits. Globes and orbits are unremarkable.  CT  CERVICAL SPINE FINDINGS  No acute fracture, malalignment or prevertebral soft tissue swelling. Unremarkable CT appearance of the thyroid gland. No acute soft tissue abnormality. The lung apices are unremarkable.  IMPRESSION: CT HEAD  1. Negative 2. 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 CT CSPINE  1. No acute fracture or malalignment.   Electronically Signed   By: Malachy Moan M.D.   On: 08/11/2014 19:32   Ct Cervical Spine Wo Contrast  08/11/2014   CLINICAL DATA:  38 year old male status post fall while riding horse with horse then rolling on top of him.  EXAM: CT HEAD WITHOUT CONTRAST  CT CERVICAL SPINE WITHOUT CONTRAST  TECHNIQUE: Multidetector CT imaging of the head and cervical spine was performed following the standard protocol without intravenous contrast. Multiplanar CT image reconstructions of the cervical spine were also generated.  COMPARISON:  None.  FINDINGS: CT HEAD FINDINGS  Negative for acute intracranial hemorrhage, acute infarction, mass, mass effect, hydrocephalus or midline shift. Gray-white differentiation is preserved throughout. No focal soft tissue or  calvarial abnormality. Mucoperiosteal thickening noted in the right maxillary sinus. Otherwise, the mastoid air cells and visualized paranasal sinuses are within normal limits. Globes and orbits are unremarkable.  CT CERVICAL SPINE FINDINGS  No acute fracture, malalignment or prevertebral soft tissue swelling. Unremarkable CT appearance of the thyroid gland. No acute soft tissue abnormality. The lung apices are unremarkable.  IMPRESSION: CT HEAD  1. Negative 2. 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 CT CSPINE  1. No acute fracture or malalignment.   Electronically Signed   By: Malachy MoanHeath  McCullough M.D.   On: 08/11/2014 19:32   Ct Abdomen Pelvis W Contrast  08/11/2014   CLINICAL DATA:  Larey SeatFell from horse.  Horse rolled on  top of patient.  EXAM: CT ABDOMEN AND PELVIS WITH  CONTRAST  TECHNIQUE: Multidetector CT imaging of the abdomen and pelvis was performed using the standard protocol following bolus administration of intravenous contrast.  CONTRAST:  100 mL Omnipaque 300 IV  COMPARISON:  Pelvis radiograph from today  FINDINGS: Lung bases are clear. No infiltrate or effusion. No lower rib fracture identified.  Liver and spleen are normal without evidence of injury or hematoma. Pancreas is normal without edema or mass. Gallbladder and bile ducts normal.  Kidneys are normal.  No renal mass or obstruction or stone.  Negative for bowel obstruction or bowel thickening. No bowel hematoma. Appendix is normal.  Fracture of the left iliac bone with angulation and displacement. Fracture extends from the iliac crest into the acetabulum. The roof of the acetabulum is displaced laterally approximately 13 mm. Femoral head is displaced laterally from the medial acetabulum and is in normal alignment with the roof of the acetabulum. No femoral neck fracture is identified. SI joint is intact. Iliac bone below the SI joint is displaced medially into the pelvis approximately 15 mm. Nondisplaced fracture of the inferior pubic ramus best seen on axial images. No fracture of the lumbar spine. Disc degeneration and spurring at L5-S1.  Left iliac artery and vein appear patent. There is mild extraperitoneal hematoma on the left around the iliac vessels and lateral to the bladder. Bladder is mildly displaced to the right due to hematoma.  IMPRESSION: No internal injury in the upper abdomen.  Displaced fracture the iliac bone extending from the iliac crest into the acetabulum. The iliac fragment is displaced laterally. Femoral head also displaced laterally. Nondisplaced fracture left inferior pubic ramus. Mild extraperitoneal hematoma on the left likely due to venous bleeding.   Electronically Signed   By: Marlan Palauharles  Clark M.D.   On: 08/11/2014 19:46   Dg Pelvis Portable  08/11/2014   CLINICAL DATA:  Fall   EXAM: PORTABLE PELVIS 1-2 VIEWS  COMPARISON:  None.  FINDINGS: Vertical fracture through the iliac bone extending from the iliac crest into the acetabulum. Fracture shows moderate displacement. Large fragment fracture fragment is displaced laterally and superiorly. No dislocation of the hip joint. SI joint intact. No other fracture.  IMPRESSION: Displaced fracture of the left iliac bone extending into the left hip joint. Fracture is displaced superiorly and laterally. CT recommended for further evaluation.   Electronically Signed   By: Marlan Palauharles  Clark M.D.   On: 08/11/2014 18:33   Dg Chest Portable 1 View  08/11/2014   CLINICAL DATA:  39 year old male status post horse related injury. Per report, a horse fell on the patient.  EXAM: PORTABLE CHEST - 1 VIEW  COMPARISON:  None.  FINDINGS: Low inspiratory volumes. The lungs are otherwise clear. Metallic artifact overlying  the right chest consistent with personal adornment. No acute fracture.  IMPRESSION: No active disease.   Electronically Signed   By: Malachy Moan M.D.   On: 08/11/2014 18:31   Dg Femur Port 1v Left  08/11/2014   CLINICAL DATA:  Crush injury of the leg.  Horse fell on patient.  EXAM: LEFT FEMUR PORTABLE 1 VIEW  COMPARISON:  None.  FINDINGS: Single views of the proximal and distal femur are present. Nonstandard projections. The visible proximal femur appears intact. Distal femur are also appears intact.  IMPRESSION: Negative.   Electronically Signed   By: Andreas Newport M.D.   On: 08/11/2014 18:43   Dg Femur Min 2 Views Left  08/11/2014   CLINICAL DATA:  39 year old male with pelvic crush injury secondary to or horseback riding accident. Pain extends down the 5. Evaluate for femoral injury.  EXAM: LEFT FEMUR 2 VIEWS  COMPARISON:  Concurrent scratch then CT scan of the abdomen and pelvis performed earlier today  FINDINGS: Incompletely imaged multifocal pelvic fractures. Nondisplaced left inferior pubic ramus fracture. Incompletely imaged  angulated fracture through the left iliac wing with involvement of the superior acetabulum.  The femur itself is intact. The femoral head remains located. The knee joint is unremarkable.  IMPRESSION: 1. No evidence of femoral fracture or malalignment. 2. Incompletely imaged pelvic fractures including left iliac wing, superior acetabulum and left inferior pubic ramus. Please see recently obtained CT scan of the abdomen and pelvis for further detail.   Electronically Signed   By: Malachy Moan M.D.   On: 08/11/2014 20:13   MDM   Final diagnoses:  Crush injury  Crush injury  Crush injury  Crush injury  Crush injury  Crush injury    This is a 39 year old male, with no pertinent past medical history, presenting today with pain to the pelvis. Onset prior to arrival, located left pelvis. Persistent, sharp, throbbing. Alleviated with morphine administered by paramedics. Radiating down the left lower extremity. Negative for focal weakness, numbness, tingling. Negative for headache, neck pain, back pain, chest pain, abdominal pain, nausea, or vomiting.  Mechanism of injury was crushed by a horse. Patient was riding a horse, and the horse rolled over on to the patient. The patient denies LOC, amnesia. Positive for minimal blood loss from superficial abrasions to the head.  On examination, airway is intact. Breath sounds are equal bilaterally. Patient's hemogram stable. GCS of 15. Patient moves all 4 extremities. Patient's properly exposed. Secondary examination is within normal limits, with the exception of superficial abrasions to the head, trunk, substantial deformity to the left pelvis, shortening and rotation of the left lower extremity. Left lower extremity is neurovascularly intact.  CT scan of the head, C-spine were without acute injury. X-ray of the chest was without acute injury. Portable x-ray of the pelvis reveals substantial pelvic fracture extending from the iliac crest through the left  acetabulum. CT scan of the abdomen and pelvis was ordered, which fortunately does not reveal intraperitoneal hemorrhage. It reveals once again substantial fracture of the pelvis. Orthopedics has evaluated the patient and will admit. Pain is well controlled. I have cleared the cervical collar by Nexus criteria.  Patient is being admitted to the orthopedics service in stable condition.   I have discussed case and care has been guided by my attending physician, Dr. Jeraldine Loots.    Loma Boston, MD 08/12/14 4098  Gerhard Munch, MD 08/15/14 [phone]

## 2014-08-12 NOTE — Consult Note (Signed)
Orthopaedic Trauma Service Consult Note  Reason: L acetabulum fracture Requesting: S. August Saucer, MD (Ortho)  Patient seen and evaluated, please see dictation:[id number]    HPI:   39 year old white male sustained left acetabulum fracture after he was thrown off a horse which subsequently landed on him. Patient brought to [place] found to have a significant left acetabulum fracture. Patient was seen and evaluated by Dr. August Saucer and placed into skeletal traction with 15 pounds applied to the left leg to help stabilize his left hemipelvis. Due to the complexity of his injury orthopedic trauma service was consulted for definitive management. Patient seen 5 N. 25 complains of severe left hip pain. Notes some tingling on the dorsum of his left foot as well. Feels sore all over and no other obvious injuries of note.  Patient recently undergoing treatment for dental abscess. Was prescribed clindamycin 300 mg po q8h 7 days, patient started his regimen on 08/08/2014. Dentist: [facility], [facility]  Patient works as a Fish farm manager for a [employer]  Allergies  Allergen Reactions  . Penicillins     REACTION: childhood reaction     Does not smoke and does not chew or Dip  Social drinker No other drugs- by his report  Review of Systems  Constitutional: Negative for fever and chills.  Eyes: Negative for blurred vision and double vision.  Respiratory: Negative for shortness of breath and wheezing.   Cardiovascular: Negative for chest pain and palpitations.  Gastrointestinal: Negative for nausea, vomiting and abdominal pain.  Genitourinary: Negative for dysuria, frequency and hematuria.  Musculoskeletal:       Left hip and low back pain  Neurological: Positive for tingling (Dorsum of left foot). Negative for headaches.     Objective   BP 113/66 mmHg  Pulse 87  Temp(Src) 98.6 F (37 C) (Oral)  Resp 16  Ht  (1.651 m)  Wt 90.7 kg (199 lb 15.3 oz)  BMI  33.27 kg/m2  SpO2 93%  Intake/Output      04/24 0701 - 04/25 0700 04/25 0701 - 04/26 0700   I.V. (mL/kg) 2600 (28.7)    Total Intake(mL/kg) 2600 (28.7)    Urine (mL/kg/hr) 700    Total Output 700     Net +1900            Labs  Results for PHI, AVANS (MRN [id number]) as of 08/12/2014 10:59  Ref. Range 08/12/2014 01:14  Color, Urine Latest Ref Range: YELLOW  YELLOW  Appearance Latest Ref Range: CLEAR  CLEAR  Specific Gravity, Urine Latest Ref Range: 1.005-1.030  1.043 (H)  pH Latest Ref Range: 5.0-8.0  5.0  Glucose Latest Ref Range: NEGATIVE mg/dL NEGATIVE  Bilirubin Urine Latest Ref Range: NEGATIVE  NEGATIVE  Ketones, ur Latest Ref Range: NEGATIVE mg/dL NEGATIVE  Protein Latest Ref Range: NEGATIVE mg/dL NEGATIVE  Urobilinogen, UA Latest Ref Range: 0.0-1.0 mg/dL 0.2  Nitrite Latest Ref Range: NEGATIVE  NEGATIVE  Leukocytes, UA Latest Ref Range: NEGATIVE  NEGATIVE  Hgb urine dipstick Latest Ref Range: NEGATIVE  NEGATIVE  Amphetamines Latest Ref Range: NONE DETECTED  NONE DETECTED  Barbiturates Latest Ref Range: NONE DETECTED  NONE DETECTED  Benzodiazepines Latest Ref Range: NONE DETECTED  NONE DETECTED  Opiates Latest Ref Range: NONE DETECTED  POSITIVE (A)  COCAINE Latest Ref Range: NONE DETECTED  POSITIVE (A)  Tetrahydrocannabinol Latest Ref Range: NONE DETECTED  POSITIVE (A)    Exam  Gen: Awake and alert, resting in bed. Skeletal traction applied to the  left leg, 15 pounds. Multiple tattoos Lungs: Clear fields bilaterally Cardiac: Regular rate and rhythm, S1 and S2 Abd: Nontender, + bowel sounds, protuberant Pelvis: Extensive ecchymosis noted lateral aspect of the left hip and flank Ext:       Left lower extremity Inspection:    Skeletal traction to the left leg     No gross deformities to the lower leg noted Bony eval:    Foot, ankle, lower leg, knee and thigh are nontender    Significant pain with gentle motion of the left leg at his hip Soft tissue:     Extensive ecchymosis noted to his left flank and left hip    Does not feel that he has a degloving injury ROM:    Range of motion of his knee and hip not assessed    Ankle range of motion intact Sensation:    DPN, SPN and TN sensory functions grossly intact to light touch Motor:    EHL, FHL, tibialis, posterior tibialis, peroneals and gastrocsoleus complex motor function are grossly intact Vascular:    + DP  pulse     Extremity is warm     No deep calf tenderness   Imaging  CT of his pelvis demonstrates severely comminuted both column acetabular fracture with medialization of his inferior pelvic fragments. There is also external rotation of his left iliac wing.    Assessment and Plan   39 year old white male s/p horse riding accident with a severely comminuted both column left acetabulum fracture   1. Horse riding accident   2. Both column left acetabulum fracture  Patient will require surgical intervention to stabilize his fracture as well as to address joint incongruity.  OR tomorrow morning for ORIF  Continue with skeletal traction for now  Check plain x-rays: AP and Judet   ice as needed    bedrest  Therapies after surgery    postoperatively patient will be touchdown weightbearing for 8 weeks with graduated weightbearing thereafter  He will be out of work for at least 12 weeks  3. Dental abscess  Called his dentist to verify his prescription which is clindamycin 300 mg every 8 hours for a total of 7 days. We will resume this. Do believe he has had adequate antibiosis to allow for us to proceed with surgical fixation of his hip at this time.  4. Positive tox screen  Social work eval  5. Pain management:  Started the following   Tylenol IV 1000mg  q6h   Ketorolac 30 mg q6h    Robaxin 1000mg  IV q8h   Oxycodone 5-10 mg po q4h prn   6. ABL anemia/Hemodynamics  CBC pending  Type and screen  7. Medical issues   None by report  8. DVT/PE prophylaxis:  SCDs for  now  Lovenox bridge to Coumadin postoperatively 8 weeks  9. ID:   Clindamycin 300 mg po q8h   10. Metabolic Bone Disease:  Will check vitamin D levels given tox screen. Additional labs based on vitamin D level  11. Activity:  Bedrest for now  12. FEN/Foley/Lines:  Clears  Npo after midnight  13.Ex-fix/Splint care/traction care:  Continue with current traction  Make sure weight is not resting on the floor  Make sure tension bow is not resting on patients skin   14. Dispo:  OR tomorrow for ORIF left acetabulum  Would anticipate discharge at the end of the week  Do not anticipate need for XRT   Patient will be out of work at  least 12 weeks    Mearl Latin, PA-C [facility] [phone] (O) 08/12/2014 10:30 AM

## 2014-08-12 NOTE — Op Note (Signed)
*   No surgery found *  12:00 AM  PATIENT:  [name]  39 y.o. male  PRE-OPERATIVE DIAGNOSIS:  Left acetabular fracture  POST-OPERATIVE DIAGNOSIS:  Left acetabular fracture  PROCEDURE: Placement of tibial traction pin   SURGEON:  Dr. Dorene GrebeScott Jolan Upchurch  ASSISTANT: None  ANESTHESIA:   local  EBL: 2 mL ml       BLOOD ADMINISTERED: none  DRAINS: none   LOCAL MEDICATIONS USED:  10 mL Marcaine  SPECIMEN:  No Specimen  COUNTS:  YES  TOURNIQUET:  * No surgery found *  DICTATION: .Other Dictation: Dictation Number [id number]  PLAN OF CARE: Admit to inpatient   PATIENT DISPOSITION:  PACU - hemodynamically stable

## 2014-08-12 NOTE — Progress Notes (Signed)
Plans for surgery tomorrow noted Patient has mild paresthesias dorsally but intact ankle dorsiflexion and plantarflexion on the left foot He has requested potential delay in court mandated appearance in June. I think he may be able to make that appointment at that time depending on how surgery goes

## 2014-08-13 ENCOUNTER — Inpatient Hospital Stay (HOSPITAL_COMMUNITY): Payer: BLUE CROSS/BLUE SHIELD

## 2014-08-13 ENCOUNTER — Encounter (HOSPITAL_COMMUNITY): Admission: EM | Disposition: A | Discharge status: Rehabilitation Facility | Payer: Self-pay | Source: Home / Self Care

## 2014-08-13 ENCOUNTER — Inpatient Hospital Stay (HOSPITAL_COMMUNITY): Payer: BLUE CROSS/BLUE SHIELD | Admitting: Anesthesiology

## 2014-08-13 DIAGNOSIS — D62 Acute posthemorrhagic anemia: Secondary | ICD-10-CM | POA: Diagnosis not present

## 2014-08-13 HISTORY — PX: ORIF ACETABULAR FRACTURE: SHX5029

## 2014-08-13 LAB — CBC
HCT: 33.2 % — ABNORMAL LOW (ref 39.0–52.0)
Hemoglobin: 11.2 g/dL — ABNORMAL LOW (ref 13.0–17.0)
MCH: 28.4 pg (ref 26.0–34.0)
MCHC: 33.7 g/dL (ref 30.0–36.0)
MCV: 84.1 fL (ref 78.0–100.0)
PLATELETS: 145 10*3/uL — AB (ref 150–400)
RBC: 3.95 MIL/uL — ABNORMAL LOW (ref 4.22–5.81)
RDW: 13.4 % (ref 11.5–15.5)
WBC: 5.8 10*3/uL (ref 4.0–10.5)

## 2014-08-13 LAB — COMPREHENSIVE METABOLIC PANEL
ALK PHOS: 53 U/L (ref 39–117)
ALT: 35 U/L (ref 0–53)
AST: 33 U/L (ref 0–37)
Albumin: 2.9 g/dL — ABNORMAL LOW (ref 3.5–5.2)
Anion gap: 8 (ref 5–15)
BILIRUBIN TOTAL: 1.3 mg/dL — AB (ref 0.3–1.2)
BUN: 9 mg/dL (ref 6–23)
CHLORIDE: 104 mmol/L (ref 96–112)
CO2: 24 mmol/L (ref 19–32)
CREATININE: 1.09 mg/dL (ref 0.50–1.35)
Calcium: 8.3 mg/dL — ABNORMAL LOW (ref 8.4–10.5)
GFR calc Af Amer: >90 mL/min (ref >90)
GFR calc non Af Amer: 85 mL/min — ABNORMAL LOW (ref >90)
Glucose, Bld: 101 mg/dL — ABNORMAL HIGH (ref 70–99)
Potassium: 4.1 mmol/L (ref 3.5–5.1)
Sodium: 136 mmol/L (ref 135–145)
Total Protein: 5.3 g/dL — ABNORMAL LOW (ref 6.0–8.3)

## 2014-08-13 LAB — MRSA PCR SCREENING: MRSA BY PCR: NEGATIVE

## 2014-08-13 LAB — PREPARE RBC (CROSSMATCH)

## 2014-08-13 SURGERY — OPEN REDUCTION INTERNAL FIXATION (ORIF) ACETABULAR FRACTURE
Anesthesia: General | Site: Hip | Laterality: Left

## 2014-08-13 MED ORDER — DEXAMETHASONE SODIUM PHOSPHATE 10 MG/ML IJ SOLN
INTRAMUSCULAR | Status: DC | PRN
Start: 2014-08-13 — End: 2014-08-13
  Administered 2014-08-13: 10 mg via INTRAVENOUS

## 2014-08-13 MED ORDER — HYDROMORPHONE HCL 1 MG/ML IJ SOLN
0.2500 mg | INTRAMUSCULAR | Status: DC | PRN
  Administered 2014-08-13: 1 mg via INTRAVENOUS

## 2014-08-13 MED ORDER — PROPOFOL 10 MG/ML IV BOLUS
INTRAVENOUS | Status: AC
  Filled 2014-08-13: qty 20

## 2014-08-13 MED ORDER — OXYCODONE HCL 5 MG PO TABS
5.0000 mg | ORAL_TABLET | ORAL | Status: DC | PRN
Start: 2014-08-13 — End: 2014-08-14
  Administered 2014-08-13 – 2014-08-14 (×2): 15 mg via ORAL
  Filled 2014-08-13 (×2): qty 3

## 2014-08-13 MED ORDER — MIDAZOLAM HCL 2 MG/2ML IJ SOLN: 0.5000 mg | Freq: Once | INTRAMUSCULAR | Status: DC | PRN

## 2014-08-13 MED ORDER — ONDANSETRON HCL 4 MG/2ML IJ SOLN
4.0000 mg | Freq: Four times a day (QID) | INTRAMUSCULAR | Status: DC | PRN
  Filled 2014-08-13: qty 2

## 2014-08-13 MED ORDER — SODIUM CHLORIDE 0.9 % IV SOLN: Freq: Once | INTRAVENOUS | Status: DC

## 2014-08-13 MED ORDER — ROCURONIUM BROMIDE 50 MG/5ML IV SOLN
INTRAVENOUS | Status: AC
  Filled 2014-08-13: qty 1

## 2014-08-13 MED ORDER — LACTATED RINGERS IV SOLN
INTRAVENOUS | Status: DC
  Administered 2014-08-13 (×4): via INTRAVENOUS

## 2014-08-13 MED ORDER — LIDOCAINE HCL (CARDIAC) 20 MG/ML IV SOLN
INTRAVENOUS | Status: AC
  Filled 2014-08-13: qty 5

## 2014-08-13 MED ORDER — GLYCOPYRROLATE 0.2 MG/ML IJ SOLN
INTRAMUSCULAR | Status: DC | PRN
  Administered 2014-08-13: .8 mg via INTRAVENOUS

## 2014-08-13 MED ORDER — LACTATED RINGERS IV SOLN
INTRAVENOUS | Status: DC
  Administered 2014-08-13: 21:00:00 via INTRAVENOUS

## 2014-08-13 MED ORDER — ONDANSETRON HCL 4 MG/2ML IJ SOLN
INTRAMUSCULAR | Status: DC | PRN
  Administered 2014-08-13: 4 mg via INTRAVENOUS

## 2014-08-13 MED ORDER — CLINDAMYCIN HCL 300 MG PO CAPS
300.0000 mg | ORAL_CAPSULE | Freq: Three times a day (TID) | ORAL | Status: AC
Start: 2014-08-15 — End: 2014-08-17
  Administered 2014-08-15 – 2014-08-17 (×8): 300 mg via ORAL
  Filled 2014-08-13 (×8): qty 1

## 2014-08-13 MED ORDER — ROCURONIUM BROMIDE 100 MG/10ML IV SOLN
INTRAVENOUS | Status: DC | PRN
  Administered 2014-08-13: 20 mg via INTRAVENOUS
  Administered 2014-08-13: 10 mg via INTRAVENOUS
  Administered 2014-08-13 (×2): 20 mg via INTRAVENOUS
  Administered 2014-08-13: 30 mg via INTRAVENOUS
  Administered 2014-08-13: 50 mg via INTRAVENOUS

## 2014-08-13 MED ORDER — ONDANSETRON HCL 4 MG PO TABS
4.0000 mg | ORAL_TABLET | Freq: Four times a day (QID) | ORAL | Status: DC | PRN
  Administered 2014-08-14: 4 mg via ORAL
  Filled 2014-08-13 (×2): qty 1

## 2014-08-13 MED ORDER — HYDROMORPHONE HCL 1 MG/ML IJ SOLN
INTRAMUSCULAR | Status: AC
  Filled 2014-08-13: qty 1

## 2014-08-13 MED ORDER — ALBUMIN HUMAN 5 % IV SOLN
INTRAVENOUS | Status: DC | PRN
Start: 2014-08-13 — End: 2014-08-13
  Administered 2014-08-13: 15:00:00 via INTRAVENOUS

## 2014-08-13 MED ORDER — GABAPENTIN 300 MG PO CAPS
300.0000 mg | ORAL_CAPSULE | Freq: Two times a day (BID) | ORAL | Status: DC
  Administered 2014-08-13 – 2014-08-19 (×12): 300 mg via ORAL
  Filled 2014-08-13 (×12): qty 1

## 2014-08-13 MED ORDER — ONDANSETRON HCL 4 MG/2ML IJ SOLN
INTRAMUSCULAR | Status: AC
  Filled 2014-08-13: qty 2

## 2014-08-13 MED ORDER — NEOSTIGMINE METHYLSULFATE 10 MG/10ML IV SOLN
INTRAVENOUS | Status: DC | PRN
  Administered 2014-08-13: 5 mg via INTRAVENOUS

## 2014-08-13 MED ORDER — KETAMINE HCL 100 MG/ML IJ SOLN
250.0000 mg | INTRAMUSCULAR | Status: DC | PRN
  Administered 2014-08-13: 1.5 ug/kg/min via INTRAVENOUS

## 2014-08-13 MED ORDER — ACETAMINOPHEN 10 MG/ML IV SOLN
INTRAVENOUS | Status: DC | PRN
  Administered 2014-08-13: 1000 mg via INTRAVENOUS

## 2014-08-13 MED ORDER — ACETAMINOPHEN 650 MG RE SUPP
650.0000 mg | Freq: Four times a day (QID) | RECTAL | Status: DC | PRN
  Filled 2014-08-13: qty 1

## 2014-08-13 MED ORDER — SODIUM CHLORIDE 0.9 % IR SOLN
Status: DC | PRN
  Administered 2014-08-13: 3000 mL

## 2014-08-13 MED ORDER — FENTANYL CITRATE (PF) 250 MCG/5ML IJ SOLN
INTRAMUSCULAR | Status: AC
  Filled 2014-08-13: qty 5

## 2014-08-13 MED ORDER — MIDAZOLAM HCL 2 MG/2ML IJ SOLN
INTRAMUSCULAR | Status: AC
  Filled 2014-08-13: qty 2

## 2014-08-13 MED ORDER — MEPERIDINE HCL 25 MG/ML IJ SOLN: 6.2500 mg | INTRAMUSCULAR | Status: DC | PRN

## 2014-08-13 MED ORDER — ENOXAPARIN SODIUM 40 MG/0.4ML ~~LOC~~ SOLN
40.0000 mg | SUBCUTANEOUS | Status: DC
  Administered 2014-08-14: 40 mg via SUBCUTANEOUS
  Filled 2014-08-13: qty 0.4

## 2014-08-13 MED ORDER — MIDAZOLAM HCL 5 MG/5ML IJ SOLN
INTRAMUSCULAR | Status: DC | PRN
  Administered 2014-08-13 (×2): 2 mg via INTRAVENOUS

## 2014-08-13 MED ORDER — KETAMINE HCL 100 MG/ML IJ SOLN
INTRAMUSCULAR | Status: DC | PRN
  Administered 2014-08-13: 45 mg via INTRAVENOUS
  Administered 2014-08-13: 20 mg via INTRAVENOUS

## 2014-08-13 MED ORDER — VECURONIUM BROMIDE 10 MG IV SOLR
INTRAVENOUS | Status: DC | PRN
  Administered 2014-08-13: 3 mg via INTRAVENOUS

## 2014-08-13 MED ORDER — SODIUM CHLORIDE 0.9 % IV SOLN: 250.0000 mg | INTRAVENOUS | Status: DC | PRN

## 2014-08-13 MED ORDER — PROMETHAZINE HCL 25 MG/ML IJ SOLN
6.2500 mg | INTRAMUSCULAR | Status: DC | PRN
Start: 2014-08-13 — End: 2014-08-13

## 2014-08-13 MED ORDER — VECURONIUM BROMIDE 10 MG IV SOLR
INTRAVENOUS | Status: AC
  Filled 2014-08-13: qty 10

## 2014-08-13 MED ORDER — CLINDAMYCIN PHOSPHATE 600 MG/50ML IV SOLN
600.0000 mg | Freq: Four times a day (QID) | INTRAVENOUS | Status: AC
  Administered 2014-08-13 – 2014-08-14 (×3): 600 mg via INTRAVENOUS
  Filled 2014-08-13 (×3): qty 50

## 2014-08-13 MED ORDER — DOCUSATE SODIUM 100 MG PO CAPS
100.0000 mg | ORAL_CAPSULE | Freq: Two times a day (BID) | ORAL | Status: DC
  Administered 2014-08-13 – 2014-08-19 (×12): 100 mg via ORAL
  Filled 2014-08-13 (×12): qty 1

## 2014-08-13 MED ORDER — POLYETHYLENE GLYCOL 3350 17 G PO PACK
17.0000 g | PACK | Freq: Every day | ORAL | Status: DC
  Administered 2014-08-14 – 2014-08-19 (×4): 17 g via ORAL
  Filled 2014-08-13 (×6): qty 1

## 2014-08-13 MED ORDER — FENTANYL CITRATE (PF) 100 MCG/2ML IJ SOLN
INTRAMUSCULAR | Status: DC | PRN
  Administered 2014-08-13 (×2): 100 ug via INTRAVENOUS
  Administered 2014-08-13: 50 ug via INTRAVENOUS
  Administered 2014-08-13: 100 ug via INTRAVENOUS
  Administered 2014-08-13: 50 ug via INTRAVENOUS
  Administered 2014-08-13 (×2): 100 ug via INTRAVENOUS
  Administered 2014-08-13: 150 ug via INTRAVENOUS

## 2014-08-13 MED ORDER — 0.9 % SODIUM CHLORIDE (POUR BTL) OPTIME
TOPICAL | Status: DC | PRN
  Administered 2014-08-13: 1000 mL

## 2014-08-13 MED ORDER — METOCLOPRAMIDE HCL 5 MG PO TABS
5.0000 mg | ORAL_TABLET | Freq: Three times a day (TID) | ORAL | Status: DC | PRN
  Administered 2014-08-16: 10 mg via ORAL
  Filled 2014-08-13 (×2): qty 2

## 2014-08-13 MED ORDER — KETAMINE HCL 100 MG/ML IJ SOLN
INTRAMUSCULAR | Status: AC
  Filled 2014-08-13: qty 1

## 2014-08-13 MED ORDER — HYDROMORPHONE HCL 1 MG/ML IJ SOLN: 0.5000 mg | INTRAMUSCULAR | Status: DC | PRN

## 2014-08-13 MED ORDER — ACETAMINOPHEN 10 MG/ML IV SOLN
1000.0000 mg | Freq: Four times a day (QID) | INTRAVENOUS | Status: AC
Start: 2014-08-13 — End: 2014-08-14
  Administered 2014-08-13 – 2014-08-14 (×3): 1000 mg via INTRAVENOUS
  Filled 2014-08-13 (×4): qty 100

## 2014-08-13 MED ORDER — ACETAMINOPHEN 10 MG/ML IV SOLN
INTRAVENOUS | Status: AC
  Filled 2014-08-13: qty 100

## 2014-08-13 MED ORDER — HYDROMORPHONE HCL 1 MG/ML IJ SOLN
1.0000 mg | INTRAMUSCULAR | Status: DC | PRN
  Administered 2014-08-13: 1 mg via INTRAVENOUS
  Filled 2014-08-13: qty 1

## 2014-08-13 MED ORDER — STERILE WATER FOR INJECTION IJ SOLN
INTRAMUSCULAR | Status: AC
  Filled 2014-08-13: qty 10

## 2014-08-13 MED ORDER — LIDOCAINE HCL (CARDIAC) 20 MG/ML IV SOLN
INTRAVENOUS | Status: DC | PRN
  Administered 2014-08-13: 100 mg via INTRAVENOUS

## 2014-08-13 MED ORDER — ACETAMINOPHEN 325 MG PO TABS
650.0000 mg | ORAL_TABLET | Freq: Four times a day (QID) | ORAL | Status: DC | PRN
  Filled 2014-08-13 (×2): qty 2

## 2014-08-13 MED ORDER — HYDROMORPHONE HCL 1 MG/ML IJ SOLN
1.0000 mg | INTRAMUSCULAR | Status: DC | PRN
  Administered 2014-08-13 – 2014-08-14 (×4): 1 mg via INTRAVENOUS
  Filled 2014-08-13 (×3): qty 1

## 2014-08-13 MED ORDER — KETOROLAC TROMETHAMINE 15 MG/ML IJ SOLN
15.0000 mg | Freq: Four times a day (QID) | INTRAMUSCULAR | Status: DC
  Administered 2014-08-14 (×3): 15 mg via INTRAVENOUS
  Filled 2014-08-13 (×2): qty 1

## 2014-08-13 MED ORDER — SUCCINYLCHOLINE CHLORIDE 20 MG/ML IJ SOLN
INTRAMUSCULAR | Status: DC | PRN
  Administered 2014-08-13: 120 mg via INTRAVENOUS

## 2014-08-13 MED ORDER — METOCLOPRAMIDE HCL 5 MG/ML IJ SOLN
5.0000 mg | Freq: Three times a day (TID) | INTRAMUSCULAR | Status: DC | PRN
  Filled 2014-08-13 (×2): qty 2

## 2014-08-13 MED ORDER — PROPOFOL 10 MG/ML IV BOLUS
INTRAVENOUS | Status: DC | PRN
  Administered 2014-08-13: 200 mg via INTRAVENOUS
  Administered 2014-08-13: 100 mg via INTRAVENOUS

## 2014-08-13 MED ORDER — DEXAMETHASONE SODIUM PHOSPHATE 10 MG/ML IJ SOLN
INTRAMUSCULAR | Status: AC
  Filled 2014-08-13: qty 2

## 2014-08-13 SURGICAL SUPPLY — 89 items
APPLIER CLIP 11 MED OPEN (CLIP) ×2
BIT DRILL AO MATTA 2.5MX230M (BIT) ×1 IMPLANT
BIT DRILL TWST MATTA 3.5MX195M (BIT) ×1 IMPLANT
BLADE SURG ROTATE 9660 (MISCELLANEOUS) ×2 IMPLANT
BRUSH SCRUB DISP (MISCELLANEOUS) ×4 IMPLANT
CLIP APPLIE 11 MED OPEN (CLIP) ×1 IMPLANT
COVER SURGICAL LIGHT HANDLE (MISCELLANEOUS) ×2 IMPLANT
DRAIN CHANNEL 10F 3/8 F FF (DRAIN) IMPLANT
DRAIN CHANNEL 15F RND FF W/TCR (WOUND CARE) IMPLANT
DRAIN PENROSE 3/4X12 (DRAIN) ×2 IMPLANT
DRAPE C-ARM 42X72 X-RAY (DRAPES) ×2 IMPLANT
DRAPE C-ARMOR (DRAPES) ×2 IMPLANT
DRAPE IMP U-DRAPE 54X76 (DRAPES) IMPLANT
DRAPE INCISE IOBAN 66X45 STRL (DRAPES) ×2 IMPLANT
DRAPE INCISE IOBAN 85X60 (DRAPES) IMPLANT
DRAPE ORTHO SPLIT 77X108 STRL (DRAPES) ×2
DRAPE SURG ORHT 6 SPLT 77X108 (DRAPES) ×2 IMPLANT
DRAPE U-SHAPE 47X51 STRL (DRAPES) ×2 IMPLANT
DRILL BIT AO MATTA 2.5MX230M (BIT) ×2
DRILL TWIST AO MATTA 3.5MX195M (BIT) ×2
DRSG AQUACEL AG ADV 3.5X10 (GAUZE/BANDAGES/DRESSINGS) ×4 IMPLANT
DRSG MEPILEX BORDER 4X12 (GAUZE/BANDAGES/DRESSINGS) IMPLANT
DRSG MEPILEX BORDER 4X8 (GAUZE/BANDAGES/DRESSINGS) IMPLANT
ELECT BLADE 6.5 EXT (BLADE) ×2 IMPLANT
ELECT REM PT RETURN 9FT ADLT (ELECTROSURGICAL) ×2
ELECTRODE REM PT RTRN 9FT ADLT (ELECTROSURGICAL) ×1 IMPLANT
EVACUATOR 1/8 PVC DRAIN (DRAIN) IMPLANT
EVACUATOR SILICONE 100CC (DRAIN) ×2 IMPLANT
GAUZE SPONGE 4X4 16PLY XRAY LF (GAUZE/BANDAGES/DRESSINGS) ×2 IMPLANT
GLOVE BIO SURGEON STRL SZ7 (GLOVE) ×2 IMPLANT
GLOVE BIO SURGEON STRL SZ7.5 (GLOVE) ×4 IMPLANT
GLOVE BIO SURGEON STRL SZ8 (GLOVE) ×4 IMPLANT
GLOVE BIOGEL PI IND STRL 6.5 (GLOVE) ×1 IMPLANT
GLOVE BIOGEL PI IND STRL 7.5 (GLOVE) ×1 IMPLANT
GLOVE BIOGEL PI IND STRL 8 (GLOVE) ×1 IMPLANT
GLOVE BIOGEL PI INDICATOR 6.5 (GLOVE) ×1
GLOVE BIOGEL PI INDICATOR 7.5 (GLOVE) ×1
GLOVE BIOGEL PI INDICATOR 8 (GLOVE) ×1
GLOVE SURG SS PI 6.0 STRL IVOR (GLOVE) ×6 IMPLANT
GOWN STRL REUS W/ TWL LRG LVL3 (GOWN DISPOSABLE) ×3 IMPLANT
GOWN STRL REUS W/ TWL XL LVL3 (GOWN DISPOSABLE) ×1 IMPLANT
GOWN STRL REUS W/TWL 2XL LVL3 (GOWN DISPOSABLE) IMPLANT
GOWN STRL REUS W/TWL LRG LVL3 (GOWN DISPOSABLE) ×3
GOWN STRL REUS W/TWL XL LVL3 (GOWN DISPOSABLE) ×1
HANDPIECE INTERPULSE COAX TIP (DISPOSABLE) ×1
K-WIRE 3.2X150 (WIRE) ×4
KIT BASIN OR (CUSTOM PROCEDURE TRAY) ×2 IMPLANT
KIT ROOM TURNOVER OR (KITS) ×2 IMPLANT
KWIRE 3.2X150 (WIRE) ×2 IMPLANT
LIGHT ORTHO (MISCELLANEOUS) ×2 IMPLANT
LOOP VESSEL MAXI BLUE (MISCELLANEOUS) IMPLANT
MANIFOLD NEPTUNE II (INSTRUMENTS) ×2 IMPLANT
NEEDLE MAYO TROCAR (NEEDLE) ×2 IMPLANT
NS IRRIG 1000ML POUR BTL (IV SOLUTION) ×2 IMPLANT
PACK TOTAL JOINT (CUSTOM PROCEDURE TRAY) ×2 IMPLANT
PACK UNIVERSAL I (CUSTOM PROCEDURE TRAY) ×2 IMPLANT
PAD ARMBOARD 7.5X6 YLW CONV (MISCELLANEOUS) ×4 IMPLANT
PIPE LIGHT STORZ FITTING (MISCELLANEOUS) ×2 IMPLANT
PLATE ACET STRT 70.5M 6H (Plate) ×2 IMPLANT
PLATE SUPRAPECTINEAL L (Plate) ×2 IMPLANT
RETRIEVER SUT HEWSON (MISCELLANEOUS) ×2 IMPLANT
SCREW CORTEX ST MATTA 3.5X18MM (Screw) ×8 IMPLANT
SCREW CORTEX ST MATTA 3.5X22MM (Screw) ×2 IMPLANT
SCREW CORTEX ST MATTA 3.5X26MM (Screw) ×8 IMPLANT
SCREW CORTEX ST MATTA 3.5X30MM (Screw) ×2 IMPLANT
SCREW CORTEX ST MATTA 3.5X32MM (Screw) ×2 IMPLANT
SCREW CORTEX ST MATTA 3.5X38M (Screw) ×2 IMPLANT
SCREW CORTEX ST MATTA 3.5X45MM (Screw) ×2 IMPLANT
SCREW CORTEX ST MATTA 3.5X50MM (Screw) ×2 IMPLANT
SCREW CORTEX ST MATTA 3.5X55MM (Screw) ×2 IMPLANT
SCREW CORTEX ST MATTA 3.5X80MM (Screw) ×2 IMPLANT
SET HNDPC FAN SPRY TIP SCT (DISPOSABLE) ×1 IMPLANT
SPONGE LAP 18X18 X RAY DECT (DISPOSABLE) ×6 IMPLANT
STAPLER VISISTAT 35W (STAPLE) ×2 IMPLANT
STRIP CLOSURE SKIN 1/2X4 (GAUZE/BANDAGES/DRESSINGS) IMPLANT
SUCTION FRAZIER TIP 10 FR DISP (SUCTIONS) ×2 IMPLANT
SUT ETHILON 3 0 PS 1 (SUTURE) ×4 IMPLANT
SUT FIBERWIRE #2 38 T-5 BLUE (SUTURE)
SUT VIC AB 0 CT1 27 (SUTURE) ×2
SUT VIC AB 0 CT1 27XBRD ANBCTR (SUTURE) ×2 IMPLANT
SUT VIC AB 1 CT1 18XCR BRD 8 (SUTURE) ×2 IMPLANT
SUT VIC AB 1 CT1 8-18 (SUTURE) ×2
SUT VIC AB 2-0 CT1 27 (SUTURE) ×1
SUT VIC AB 2-0 CT1 TAPERPNT 27 (SUTURE) ×1 IMPLANT
SUTURE FIBERWR #2 38 T-5 BLUE (SUTURE) IMPLANT
TOWEL OR 17X24 6PK STRL BLUE (TOWEL DISPOSABLE) ×4 IMPLANT
TOWEL OR 17X26 10 PK STRL BLUE (TOWEL DISPOSABLE) ×4 IMPLANT
TRAY FOLEY CATH 16FRSI W/METER (SET/KITS/TRAYS/PACK) ×2 IMPLANT
TUBE CONNECTING 12X1/4 (SUCTIONS) ×4 IMPLANT

## 2014-08-13 NOTE — Transfer of Care (Signed)
Immediate Anesthesia Transfer of Care Note  Patient: [name]  Procedure(s) Performed: Procedure(s): LEFT OPEN REDUCTION INTERNAL FIXATION (ORIF) ACETABULAR FRACTURE (Left)  Patient Location: PACU  Anesthesia Type:General  Level of Consciousness: awake, alert , oriented and patient cooperative  Airway & Oxygen Therapy: Patient Spontanous Breathing  Post-op Assessment: Report given to RN and Post -op Vital signs reviewed and stable  Post vital signs: Reviewed and stable  Last Vitals:  Filed Vitals:   08/13/14 0512  BP: 113/63  Pulse: 96  Temp: 37.1 C  Resp: 16    Complications: No apparent anesthesia complications

## 2014-08-13 NOTE — Anesthesia Postprocedure Evaluation (Signed)
  Anesthesia Post-op Note  Patient: Oralia RudSteven L Charo  Procedure(s) Performed: Procedure(s): LEFT OPEN REDUCTION INTERNAL FIXATION (ORIF) ACETABULAR FRACTURE (Left)  Patient Location: PACU  Anesthesia Type: General   Level of Consciousness: awake, alert  and oriented  Airway and Oxygen Therapy: Patient Spontanous Breathing  Post-op Pain: mild  Post-op Assessment: Post-op Vital signs reviewed  Post-op Vital Signs: Reviewed  Last Vitals:  Filed Vitals:   08/13/14 1815  BP: 137/83  Pulse: 102  Temp: 36.3 C  Resp: 21    Complications: No apparent anesthesia complications

## 2014-08-13 NOTE — Consult Note (Signed)
NAMEISREAL, MOLINE NO.:  [id number]  MEDICAL RECORD NO.:  [id number]  LOCATION:  5N25C                        FACILITY:  [facility]  PHYSICIAN:  Doralee Albino. Carola Frost, M.D. DATE OF BIRTH:  1976-04-05  DATE OF CONSULTATION:  08/12/2014 DATE OF DISCHARGE:                                CONSULTATION   CHIEF COMPLAINT:  Horse riding accident with left acetabulum fracture.  REQUESTING PHYSICIAN:  Burnard Bunting, MD.  HISTORY OF PRESENT ILLNESS:  Mr. Mudrick is a 39 year old white male, who sustained a left acetabular fracture after being thrown off his horse which subsequently landed on top of him.  The patient was brought to [facility] for evaluation and was found to have a significant injury to his left acetabulum.  The patient was seen and evaluated by Dr. August Saucer who was on call for Orthopedics last night.  The patient was placed into a skeletal traction with 15 pounds on the left leg to help stabilize his left hemipelvis as well as his acetabulum.  Due to the complexity of his injury, Orthopedic Trauma Service was consulted for definitive management, and the General Trauma Service was consulted regarding evaluation given the mechanism of injury.  The patient was transferred to the orthopedic floor for observation and pain control.  The patient was seen by the internal Trauma Service and has been cleared.  They do not believe he has any intraabdominal injury.  The patient was seen this morning [address] [place] on August 12, 2014, complains primarily of left hip pain. Does note some tingling on the dorsum of his left foot as well and in general just feels sore all over but no obvious injuries that he cannot note.  The patient was recently undergoing treatment for dental abscess and was prescribed clindamycin 300 mg p.o. q.8 hours x7 days which was done on August 08, 2014, by his family dentist, [facility] in [place].  He does report a history of numerous other  equestrian accidents, but none significant and this was the patient's horse.  PAST MEDICAL HISTORY:  Notable for history of kidney stones and chicken pox.  SURGICAL HISTORY:  Tonsillectomy.  FAMILY HISTORY:  Cancer including prostate cancer, stroke, heart disease, and lung cancer.  SOCIAL HISTORY:  The patient is a former smoker quit in 2006.  Drinks socially primarily on the weekend and does have a history of drug abuse in the past.  States that he quit in 2009 with the use of marijuana, pain pills, and cocaine.  Works as a Archivist. He states that he does not use any additional drugs.  The patient does not smoke, does not chew.  REVIEW OF SYSTEMS:  CONSTITUTIONAL:  Negative for fevers and chills. EYES:  Negative for blurred vision, or double vision.  RESPIRATORY: Negative for shortness of breath or wheezing.  CV:  Negative for chest pain or palpitations.  GI:  Negative for nausea, vomiting, abdominal pain.  GU:  Negative for dysuria, frequency, or hematuria. MUSCULOSKELETAL:  Notable for a left hip and low back pain.  NEUROLOGIC: Notable for tingling on the dorsum of the left foot.  Negative for headache.  PHYSICAL EXAMINATION:  VITAL SIGNS:  Temperature 98.6, heart rate 87, respirations 18, sats 93% on room air, BP is 113/66, weight is 90.7 kilos.  Height is 5 feet 5 inches or 1.65 m.  BMI is 33.27 kg/m2.  I and O was reviewed. GENERAL:  The patient is awake, alert, resting in bed.  Still traction applied to the left leg 15 pounds.  The patient has multiple tattoos. LUNGS:  Clear fields noted bilaterally. CARDIAC:  S1, S2.  Regular rate and rhythm. ABDOMEN:  Nontender.  Positive bowel sounds.  Protuberant. Nondistended. PELVIS: Extensive ecchymosis to the lateral aspect of the left tibial flank. EXTREMITIES:  Skeletal traction on the left leg is noted again 15 pounds.  No gross deformities in the left leg as he is in traction right now.  Ongoing  evaluation foot, ankle, lower leg, knee and thigh are nontender.  There is significant pain with gentle motion of the left leg and hip.  Soft tissues extensive ecchymosis in the left flank and left hip.  I do not feel that he has a degloving injury.  Range of motion of the knee is not assessed.  Ankle range of motion is intact and full. Sensation in DPN, SPN, TN sensory functions grossly intact to light touch.  Motor, EHL, FHL, anterior tibialis, and peroneals, gastrocsoleus complex motor functions are grossly intact.  Vascular, palpable dorsalis pedis pulse, extremities warm, no deep calf tenderness.  There was no ankle or knee effusion and no traumatic wounds.  LABORATORY DATA:  His UA is unremarkable.  Urine tox screen is notable for cocaine and marijuana.  Opiates positive but this is likely reflective of pain medicine received in the hospital.  IMAGING:  CT scan of his pelvis demonstrates a severely comminuted both column acetabulum fracture with medialization of his inferior pelvic fragment.  There was also external rotation of his left iliac wing.  ASSESSMENT AND PLAN:  A 39 year old white male who is status post horse riding accident with fairly comminuted both column acetabulum fracture. 1. Horse riding accident. 2. Both column left acetabulum fracture.  The patient will require     surgical intervention to stabilize his fracture as well as to     address the joint surface congruity.  Plan for the OR tomorrow     morning for ORIF.  Continue with girdle traction now.  We will     tract plain x-rays of his pelvis AP in 2 days.  Continue with ice     as needed.  Continue with bedrest.  Therapies after Surgery.     Postoperatively, the patient will be touchdown weightbearing and     graduated weightbearing thereafter.  He will likely be out of work     for at least 12 weeks. 3. Dental abscess.  I did call his dentist to verify prescription     which is clindamycin 600 mg p.o. q.8  hours for a total of 7 days, I     will resume this.  I do believe that he has had adequate     antibiotics to allow [place] to proceed with surgical fixation of his     hip at this time without any heightened risk, although there does     remain some risk for infection of his hip.  This does increase his     chance of infection. 4. Positive tox screen. 5. Pain management, started on the following:  Tylenol IV at 1000 mg     q.6 hours, ketorolac 30 mg  q.6 hours, Robaxin 1000 mg IV q.8 hours,     oxycodone 5-10 mg q.4 hours p.r.n. 6. Acute blood loss anemia.  Hemodynamics, CBC is pending.  Type and     screen. 7. Medical issues by report. 8. Deep vein thrombosis and pulmonary embolus prophylaxis.  SCDs for     now.  Lovenox bridge to Coumadin postoperatively for 8 weeks. 9. IV clindamycin 300 mg q.8 hours p.o. 10.Metabolic bone disease.  We will check vitamin D level given his     tox screen.  Additional labs based on vitamin D level. 11.Activity, bedrest for now. 12.FEN/Foley/line.  Clears for now and n.p.o. after midnight. 13.Ex-fix/splint care/traction care.  Continue with current traction.     Make sure the weights are not resting on the     floor and make sure tension bow was not resting on the patient's     skin. 14.Disposition:  To OR tomorrow for ORIF, left acetabulum.  Would     anticipate discharge at the end of the week.  Do not anticipate     need for XRT.  The patient will again likely be out of work for at     least 12 weeks.     Mearl LatinKeith W Lamarco Gudiel, PA-C   ______________________________ Doralee AlbinoMichael H. [name], M.D.    KWP/MEDQ  D:  08/12/2014  T:  08/13/2014  Job:  [id number]

## 2014-08-13 NOTE — Anesthesia Preprocedure Evaluation (Addendum)
Anesthesia Evaluation  Patient identified by MRN, date of birth, ID band Patient awake    Reviewed: Allergy & Precautions, NPO status , Patient's Chart, lab work & pertinent test results  History of Anesthesia Complications Negative for: history of anesthetic complications  Airway Mallampati: II  TM Distance: >3 FB Neck ROM: Full    Dental  (+) Chipped, Dental Advisory Given   Pulmonary COPDformer smoker (quit 2006),  breath sounds clear to auscultation        Cardiovascular negative cardio ROS  Rhythm:Regular Rate:Normal     Neuro/Psych negative neurological ROS     GI/Hepatic Neg liver ROS, GERD-  Medicated and Controlled,  Endo/Other  Morbid obesity  Renal/GU negative Renal ROS     Musculoskeletal   Abdominal (+) + obese,   Peds  Hematology  (+) Blood dyscrasia (Hb 11.2), ,   Anesthesia Other Findings Pelvic and acetab fractures  Reproductive/Obstetrics                            Anesthesia Physical Anesthesia Plan  ASA: II  Anesthesia Plan: General   Post-op Pain Management:    Induction: Intravenous  Airway Management Planned: Oral ETT  Additional Equipment:   Intra-op Plan:   Post-operative Plan: Extubation in OR  Informed Consent:   Dental advisory given  Plan Discussed with: CRNA and Surgeon  Anesthesia Plan Comments: (Plan routine monitors, GETA)        Anesthesia Quick Evaluation

## 2014-08-13 NOTE — Anesthesia Procedure Notes (Signed)
Procedure Name: Intubation Date/Time: 08/13/2014 1:09 PM Performed by: Orlinda BlalockMCMILLEN, Akram Kissick L Pre-anesthesia Checklist: Patient identified, Emergency Drugs available, Suction available, Patient being monitored and Timeout performed Patient Re-evaluated:Patient Re-evaluated prior to inductionOxygen Delivery Method: Circle system utilized Preoxygenation: Pre-oxygenation with 100% oxygen Intubation Type: IV induction Ventilation: Mask ventilation with difficulty Laryngoscope Size: Mac and 4 Grade View: Grade I Tube type: Oral Tube size: 7.5 mm Number of attempts: 1 Airway Equipment and Method: Stylet Placement Confirmation: ETT inserted through vocal cords under direct vision,  positive ETCO2 and breath sounds checked- equal and bilateral Secured at: 20 cm Tube secured with: Tape Dental Injury: Teeth and Oropharynx as per pre-operative assessment

## 2014-08-13 NOTE — Progress Notes (Signed)
Called report to Short stay  

## 2014-08-13 NOTE — Progress Notes (Signed)
Patient ID: [name], male   DOB: Dec 31, 1975, 39 y.o.   MRN: [id number]   LOS: 1 day   Subjective: No unexpected c/o.   Objective: Vital signs in last 24 hours: Temp:  [98.3 F (36.8 C)-98.7 F (37.1 C)] 98.7 F (37.1 C) (04/26 0512) Pulse Rate:  [74-96] 96 (04/26 0512) Resp:  [16-17] 16 (04/26 0512) BP: (102-113)/(55-66) 113/63 mmHg (04/26 0512) SpO2:  [93 %-100 %] 99 % (04/26 0512) Last BM Date: 08/11/14   Laboratory  CBC  Recent Labs  08/12/14 1013 08/13/14 0647  WBC 6.2 5.8  HGB 11.8* 11.2*  HCT 34.9* 33.2*  PLT 148* 145*   BMET  Recent Labs  08/12/14 1013 08/13/14 0647  NA 138 136  K 3.8 4.1  CL 108 104  CO2 22 24  GLUCOSE 100* 101*  BUN 14 9  CREATININE 1.16 1.09  CALCIUM 8.4 8.3*     Physical Exam General appearance: alert and no distress Resp: clear to auscultation bilaterally Cardio: regular rate and rhythm GI: normal findings: bowel sounds normal and soft, non-tender Extremities: Some numbness LLE   Assessment/Plan: Fall from horse Multiple pelvis fxs -- For ORIF today by Dr. [name] ABL anemia -- Mild FEN -- Bowel regimen, adjust pain meds VTE -- SCD's, Lovenox Dispo -- OR    Freeman CaldronMichael J. Keeana Pieratt, PA-C Pager: [phone] General Trauma PA Pager: [phone]  08/13/2014

## 2014-08-14 ENCOUNTER — Encounter (HOSPITAL_COMMUNITY): Payer: Self-pay | Admitting: Orthopedic Surgery

## 2014-08-14 LAB — VITAMIN D 25 HYDROXY (VIT D DEFICIENCY, FRACTURES): VIT D 25 HYDROXY: 21.9 ng/mL — AB (ref 30.0–100.0)

## 2014-08-14 LAB — CBC
HEMATOCRIT: 21.4 % — AB (ref 39.0–52.0)
HEMOGLOBIN: 7.7 g/dL — AB (ref 13.0–17.0)
MCH: 29.5 pg (ref 26.0–34.0)
MCHC: 36 g/dL (ref 30.0–36.0)
MCV: 82 fL (ref 78.0–100.0)
Platelets: 188 10*3/uL (ref 150–400)
RBC: 2.61 MIL/uL — AB (ref 4.22–5.81)
RDW: 13.4 % (ref 11.5–15.5)
WBC: 8.7 10*3/uL (ref 4.0–10.5)

## 2014-08-14 LAB — BASIC METABOLIC PANEL
Anion gap: 9 (ref 5–15)
BUN: 14 mg/dL (ref 6–23)
CALCIUM: 7.8 mg/dL — AB (ref 8.4–10.5)
CO2: 22 mmol/L (ref 19–32)
CREATININE: 1.18 mg/dL (ref 0.50–1.35)
Chloride: 99 mmol/L (ref 96–112)
GFR calc Af Amer: 89 mL/min — ABNORMAL LOW (ref >90)
GFR, EST NON AFRICAN AMERICAN: 77 mL/min — AB (ref >90)
Glucose, Bld: 161 mg/dL — ABNORMAL HIGH (ref 70–99)
Potassium: 4.3 mmol/L (ref 3.5–5.1)
SODIUM: 130 mmol/L — AB (ref 135–145)

## 2014-08-14 LAB — PREPARE RBC (CROSSMATCH)

## 2014-08-14 MED ORDER — ALUM & MAG HYDROXIDE-SIMETH 200-200-20 MG/5ML PO SUSP
30.0000 mL | ORAL | Status: DC | PRN
  Administered 2014-08-16 – 2014-08-18 (×5): 30 mL via ORAL
  Filled 2014-08-14 (×5): qty 30

## 2014-08-14 MED ORDER — SODIUM CHLORIDE 0.9 % IV SOLN: Freq: Once | INTRAVENOUS | Status: DC

## 2014-08-14 MED ORDER — OXYCODONE HCL 5 MG PO TABS
10.0000 mg | ORAL_TABLET | ORAL | Status: DC | PRN
  Administered 2014-08-14 (×3): 15 mg via ORAL
  Administered 2014-08-15 – 2014-08-16 (×10): 20 mg via ORAL
  Administered 2014-08-17: 15 mg via ORAL
  Administered 2014-08-17 – 2014-08-19 (×9): 20 mg via ORAL
  Filled 2014-08-14: qty 3
  Filled 2014-08-14: qty 4
  Filled 2014-08-14 (×2): qty 2
  Filled 2014-08-14 (×3): qty 4
  Filled 2014-08-14: qty 3
  Filled 2014-08-14 (×8): qty 4
  Filled 2014-08-14: qty 3
  Filled 2014-08-14 (×6): qty 4
  Filled 2014-08-14: qty 3

## 2014-08-14 MED ORDER — FUROSEMIDE 10 MG/ML IJ SOLN: 20.0000 mg | Freq: Once | INTRAMUSCULAR | Status: AC

## 2014-08-14 MED ORDER — ENOXAPARIN SODIUM 30 MG/0.3ML ~~LOC~~ SOLN
30.0000 mg | Freq: Two times a day (BID) | SUBCUTANEOUS | Status: DC
  Administered 2014-08-14 – 2014-08-19 (×10): 30 mg via SUBCUTANEOUS
  Filled 2014-08-14 (×10): qty 0.3

## 2014-08-14 MED ORDER — DIPHENHYDRAMINE HCL 25 MG PO CAPS
25.0000 mg | ORAL_CAPSULE | Freq: Once | ORAL | Status: AC
  Administered 2014-08-14: 25 mg via ORAL
  Filled 2014-08-14: qty 1

## 2014-08-14 MED ORDER — HYDROMORPHONE HCL 1 MG/ML IJ SOLN
1.0000 mg | INTRAMUSCULAR | Status: DC | PRN
  Administered 2014-08-14 – 2014-08-17 (×14): 1 mg via INTRAVENOUS
  Filled 2014-08-14 (×15): qty 1

## 2014-08-14 MED ORDER — FUROSEMIDE 10 MG/ML IJ SOLN
20.0000 mg | Freq: Once | INTRAMUSCULAR | Status: AC
  Administered 2014-08-14: 20 mg via INTRAVENOUS
  Filled 2014-08-14: qty 2

## 2014-08-14 MED ORDER — VITAMIN D 1000 UNITS PO TABS
1000.0000 [IU] | ORAL_TABLET | Freq: Two times a day (BID) | ORAL | Status: DC
  Administered 2014-08-14 – 2014-08-19 (×11): 1000 [IU] via ORAL
  Filled 2014-08-14 (×11): qty 1

## 2014-08-14 MED ORDER — VITAMIN C 500 MG PO TABS
500.0000 mg | ORAL_TABLET | Freq: Two times a day (BID) | ORAL | Status: DC
  Administered 2014-08-14 – 2014-08-19 (×11): 500 mg via ORAL
  Filled 2014-08-14 (×11): qty 1

## 2014-08-14 MED ORDER — VITAMIN D (ERGOCALCIFEROL) 1.25 MG (50000 UNIT) PO CAPS
50000.0000 [IU] | ORAL_CAPSULE | ORAL | Status: DC
  Administered 2014-08-14: 50000 [IU] via ORAL
  Filled 2014-08-14: qty 1

## 2014-08-14 NOTE — Progress Notes (Signed)
Patient ID: [name], male   DOB: 16-Jan-1976, 39 y.o.   MRN: [id number]   LOS: 2 days   Subjective: Pretty sore in hip/groin.   Objective: Vital signs in last 24 hours: Temp:  [97.3 F (36.3 C)-98.9 F (37.2 C)] 98.9 F (37.2 C) (04/27 0533) Pulse Rate:  [79-127] 98 (04/27 0533) Resp:  [18-29] 18 (04/27 0533) BP: (111-137)/(59-83) 111/63 mmHg (04/27 0533) SpO2:  [93 %-99 %] 99 % (04/27 0533) Last BM Date: 08/11/14   Laboratory  CBC  Recent Labs  08/13/14 0647 08/14/14 0553  WBC 5.8 8.7  HGB 11.2* 7.7*  HCT 33.2* 21.4*  PLT 145* 188   BMET  Recent Labs  08/13/14 0647 08/14/14 0553  NA 136 130*  K 4.1 4.3  CL 104 99  CO2 24 22  GLUCOSE 101* 161*  BUN 9 14  CREATININE 1.09 1.18  CALCIUM 8.3* 7.8*    Physical Exam General appearance: alert and no distress Resp: clear to auscultation bilaterally Cardio: regular rate and rhythm GI: normal findings: bowel sounds normal and soft, non-tender Extremities: NVI   Assessment/Plan: Fall from horse Multiple pelvis fxs s/p ORIF -- TDWB LLE per Dr. [name] ABL anemia -- Getting 2 units PRBC's today FEN -- Increase OxyIR range, D/C foley, tolerating clears so will advance diet VTE -- SCD's, Lovenox (increase for weight) Dispo -- PT/OT    Freeman CaldronMichael J. Shawntelle Ungar, PA-C Pager: [phone] General Trauma PA Pager: [phone]  08/14/2014

## 2014-08-14 NOTE — Evaluation (Signed)
Occupational Therapy Evaluation Patient Details Name: [name] MRN: [id number] DOB: [birth year] Today's Date: 08/14/2014    History of Present Illness 39 yo male s/p Multiple pelvis fxs s/p ORIF(TDWB LLE) after falling off horse    Clinical Impression   Patient independent PTA. Patient currently requires up to total assist for LB ADLs, min assist for sit<>stands using RW, min assist for squat pivot transfers, unable to perform stand pivot transfers at this time secondary to increased pain and WB restricitions. Patient will benefit from acute OT to increase overall independence in the areas of ADLs, functional mobility, and overall safety in order to safely discharge to venue listed below.     Follow Up Recommendations  CIR;Supervision/Assistance - 24 hour    Equipment Recommendations  Other (comment) (TBD next venue of care)    Recommendations for Other Services Rehab consult     Precautions / Restrictions Precautions Precautions: Fall Restrictions Weight Bearing Restrictions: Yes LLE Weight Bearing: Touchdown weight bearing      Mobility Bed Mobility Overal bed mobility: Needs Assistance Bed Mobility: Rolling;Sidelying to Sit Rolling: Min assist Sidelying to sit: Min assist;HOB elevated       General bed mobility comments: Management > LLE. Patient heavily relying on bed rails and HOB elevated. Cues required for technique and safety.   Transfers Overall transfer level: Needs assistance Equipment used: Rolling walker (2 wheeled) Transfers: Sit to/from Stand Sit to Stand: Min assist         General transfer comment: Cues required to maintain TDWB>LLE, technique, safety, and hand placement     Balance Overall balance assessment: Needs assistance Sitting-balance support: No upper extremity supported;Feet unsupported Sitting balance-Leahy Scale: Poor     Standing balance support: Bilateral upper extremity supported;During functional activity Standing  balance-Leahy Scale: Poor    ADL Overall ADL's : Needs assistance/impaired Eating/Feeding: Set up;Bed level   Grooming: Set up;Bed level   Upper Body Bathing: Minimal assitance;Sitting   Lower Body Bathing: Total assistance;Sitting/lateral leans;Cueing for safety   Upper Body Dressing : Minimal assistance;Sitting   Lower Body Dressing: Total assistance;Sitting/lateral leans;Cueing for safety General ADL Comments: Patient engaged in bed mobility (heavily relying on bed rails) and sat EOB. Patient min assist without UE support seated EOB (RLE supported). Patient able to perform sit<>stand with min assist and cues for TDWB > LLE. Patient with increased pain in testicles during bed mobility and sit<>stand transfers. Patient attempted stand pivot transfer with RW, but unable due to pain and WB restricitions. Therefore, patient performed squat pivot from bed > recliner > right side. Notified RN of needed pain medication and educated patient on elevation of LLE and testicles to help decrease pain.     Pertinent Vitals/Pain Pain Assessment: 0-10 Pain Score: 5  Pain Location: LLE and buttock Pain Descriptors / Indicators: Aching Pain Intervention(s): Limited activity within patient's tolerance;Monitored during session;Repositioned     Hand Dominance Right   Extremity/Trunk Assessment Upper Extremity Assessment Upper Extremity Assessment: Overall WFL for tasks assessed   Lower Extremity Assessment Lower Extremity Assessment: Defer to PT evaluation   Cervical / Trunk Assessment Cervical / Trunk Assessment: Normal   Communication Communication Communication: No difficulties   Cognition Arousal/Alertness: Awake/alert Behavior During Therapy: WFL for tasks assessed/performed Overall Cognitive Status: Within Functional Limits for tasks assessed              Home Living Family/patient expects to be discharged to:: Private residence Living Arrangements: Parent Available Help at  Discharge: Family;Available PRN/intermittently (mom and family  works, but reports he knows people that can check on him throughout the day) Type of Home: House Home Access: Stairs to enter [employer] of Steps: 3 Entrance Stairs-Rails: Can reach both Home Layout: One level     Bathroom Shower/Tub: IT trainer: Standard     Home Equipment: None (reports a friend of him "has all that stuff")   Additional Comments: Above information is based on mother's house, patient states he plans to discharge there      Prior Functioning/Environment Level of Independence: Independent     OT Diagnosis: Generalized weakness;Acute pain   OT Problem List: Decreased strength;Decreased range of motion;Decreased activity tolerance;Impaired balance (sitting and/or standing);Decreased coordination;Decreased knowledge of use of DME or AE;Decreased knowledge of precautions;Decreased safety awareness;Pain   OT Treatment/Interventions: Self-care/ADL training;Therapeutic exercise;Energy conservation;DME and/or AE instruction;Therapeutic activities;Patient/family education;Balance training    OT Goals(Current goals can be found in the care plan section) Acute Rehab OT Goals Patient Stated Goal: decrease pain OT Goal Formulation: With patient Time For Goal Achievement: 08/28/14 Potential to Achieve Goals: Good ADL Goals Pt Will Perform Grooming: with set-up;sitting Pt Will Perform Upper Body Bathing: with set-up;sitting Pt Will Perform Lower Body Bathing: with mod assist;sitting/lateral leans;with adaptive equipment Pt Will Perform Upper Body Dressing: with set-up;sitting Pt Will Perform Lower Body Dressing: with mod assist;sitting/lateral leans;with adaptive equipment Pt Will Transfer to Toilet: with min assist;bedside commode;stand pivot transfer (using RW) Additional ADL Goal #1: Patient will independently adere to TDWB>LLE 100% of the time during functional  mobility and self-care tasks  OT Frequency: Min 2X/week   Barriers to D/C: Decreased caregiver support  Patient with intermittent assist post acute d/c, states that family works   End of Journalist, newspaper During Treatment: Careers adviser Communication: Mobility status;Weight bearing status (notified RN and NT)  Activity Tolerance: Patient limited by pain Patient left: in chair;with call bell/phone within reach   Time: 1419-1451 OT Time Calculation (min): 32 min Charges:  OT General Charges $OT Visit: 1 Procedure OT Evaluation $Initial OT Evaluation Tier I: 1 Procedure OT Treatments $Self Care/Home Management : 8-22 mins  Amaziah Raisanen , MS, OTR/L, CLT Pager: [phone]  08/14/2014, 3:13 PM

## 2014-08-14 NOTE — Progress Notes (Signed)
CSW Intern attempted to discuss d/c plans with pt. However pt was sleep and did not respond to CSW Intern's calls or knocks. Unit CSW to f/u/

## 2014-08-14 NOTE — Progress Notes (Signed)
Orthopedic Tech Progress Note Patient Details:  Oralia RudSteven L Relph [birth year] [id number]  Ortho Devices Ortho Device/Splint Location: trapeze bar patient helper Ortho Device/Splint Interventions: Application   Nikki Domrawford, Teryl Mcconaghy 08/14/2014, 7:56 AM

## 2014-08-14 NOTE — Evaluation (Signed)
Physical Therapy Evaluation Patient Details Name: [name] MRN: [id number] DOB: January 13, 1976 Today's Date: 08/14/2014   History of Present Illness  39 yo male s/p Multiple pelvis fxs s/p ORIF(TDWB LLE) after falling off horse   Clinical Impression  Patient is s/p above surgery resulting in functional limitations due to the deficits listed below (see PT Problem List). Pt demonstrated ability to perform sit>stand x2 this session.  Main limiting factor this session was pt's testicular pain.  Pt will benefit from intensive rehab to increase functional independence.  Patient will benefit from skilled PT to increase their independence and safety with mobility to allow discharge to the venue listed below.      Follow Up Recommendations CIR;Supervision for mobility/OOB    Equipment Recommendations  3in1 (PT)    Recommendations for Other Services Rehab consult     Precautions / Restrictions Precautions Precautions: Fall Restrictions Weight Bearing Restrictions: Yes LLE Weight Bearing: Touchdown weight bearing      Mobility  Bed Mobility Overal bed mobility: Needs Assistance Bed Mobility: Rolling;Sidelying to Sit Rolling: Min assist Sidelying to sit: Min assist;HOB elevated       General bed mobility comments: in recliner  Transfers Overall transfer level: Needs assistance Equipment used: Rolling walker (2 wheeled) Transfers: Sit to/from Stand Sit to Stand: Min assist;From elevated surface (pillows on recliner to elevate surface)         General transfer comment: Cues to push from reciner rather than pull on RW.  Cues to bring LLE out in front to adhere to WB precautions.  Pt w/ increased pain in testicles while standing.  Ambulation/Gait             General Gait Details: not attempted this session  Stairs            Wheelchair Mobility    Modified Rankin (Stroke Patients Only)       Balance Overall balance assessment: Needs  assistance Sitting-balance support: Bilateral upper extremity supported;Feet supported Sitting balance-[name] Scale: Poor     Standing balance support: Bilateral upper extremity supported;During functional activity Standing balance-[name] Scale: Poor                               Pertinent Vitals/Pain Pain Assessment: 0-10 Pain Score: 9  Pain Location: "testicles" Pain Descriptors / Indicators: Constant;Grimacing;Guarding;Aching Pain Intervention(s): Monitored during session;Limited activity within patient's tolerance;Repositioned    Home Living Family/patient expects to be discharged to:: Private residence (to mother's house at d/c) Living Arrangements: Parent Available Help at Discharge: Family;Available PRN/intermittently (mom and family works, but reports he knows people that can c) Type of Home: House Home Access: Stairs to enter Entrance Stairs-Rails: Can reach both Entrance Stairs-Number of Steps: 3 Home Layout: One level Home Equipment: None (Pt has ordered RW and manual WC to be at home upon d/c) Additional Comments: Above information is based on mother's house, patient states he plans to discharge there    Prior Function Level of Independence: Independent               Hand Dominance   Dominant Hand: Right    Extremity/Trunk Assessment   Upper Extremity Assessment: Defer to OT evaluation           Lower Extremity Assessment: LLE deficits/detail   LLE Deficits / Details: weakness and limited ROM as expected s/p L hip ORIF  Cervical / Trunk Assessment: Normal  Communication   Communication: No difficulties  Cognition Arousal/Alertness: Awake/alert Behavior During Therapy: WFL for tasks assessed/performed Overall Cognitive Status: Within Functional Limits for tasks assessed                      General Comments      Exercises Total Joint Exercises Ankle Circles/Pumps: AROM;Both;10 reps;Seated Quad Sets: AROM;Both;5  reps;Seated Heel Slides: AAROM;Left;5 reps;Seated Long Arc Quad: AROM;Left;10 reps;Seated      Assessment/Plan    PT Assessment Patient needs continued PT services  PT Diagnosis Difficulty walking;Abnormality of gait;Generalized weakness;Acute pain   PT Problem List Decreased strength;Decreased range of motion;Decreased activity tolerance;Decreased balance;Decreased mobility;Decreased coordination;Decreased knowledge of use of DME;Decreased safety awareness;Decreased knowledge of precautions;Decreased skin integrity;Pain  PT Treatment Interventions DME instruction;Gait training;Stair training;Functional mobility training;Therapeutic activities;Therapeutic exercise;Balance training;Neuromuscular re-education;Patient/family education;Modalities;Wheelchair mobility training   PT Goals (Current goals can be found in the Care Plan section) Acute Rehab PT Goals Patient Stated Goal: decrease pain PT Goal Formulation: With patient Time For Goal Achievement: 08/14/14 Potential to Achieve Goals: Good    Frequency Min 5X/week   Barriers to discharge Inaccessible home environment;Decreased caregiver support 3 steps to enter home w/o rails; min assist at home    Co-evaluation               End of Session Equipment Utilized During Treatment: Gait belt Activity Tolerance: Patient limited by pain Patient left: in chair;with call bell/phone within reach;with family/visitor present;with nursing/sitter in room Nurse Communication: Mobility status;Precautions;Weight bearing status;Patient requests pain meds         Time: [id number] PT Time Calculation (min) (ACUTE ONLY): 25 min   Charges:   PT Evaluation $Initial PT Evaluation Tier I: 1 Procedure PT Treatments $Therapeutic Exercise: 8-22 mins   PT G Codes:       Michail JewelsAshley Parr PT, DPT [phone] Pager: [phone] 08/14/2014, 4:48 PM

## 2014-08-14 NOTE — Progress Notes (Signed)
Orthopaedic Trauma Service Progress Note  Subjective  Doing well Sore L hip and testicle    Review of Systems  Constitutional: Negative for fever and chills.  Respiratory: Negative for shortness of breath and wheezing.   Cardiovascular: Negative for chest pain and palpitations.  Gastrointestinal: Positive for heartburn.  Genitourinary:       Foley  Neurological: Negative for headaches.     Objective   BP 111/63 mmHg  Pulse 98  Temp(Src) 98.9 F (37.2 C) (Oral)  Resp 18  Ht $R'5\' 5"'nl$  (1.651 m)  Wt 90.7 kg (199 lb 15.3 oz)  BMI 33.27 kg/m2  SpO2 99%  Intake/Output      04/26 0701 - 04/27 0700 04/27 0701 - 04/28 0700   P.O. 480    I.V. (mL/kg) 4200 (46.3)    IV Piggyback 250    Total Intake(mL/kg) 4930 (54.4)    Urine (mL/kg/hr) 380 (0.2)    Blood 400 (0.2)    Total Output 780     Net +4150            Labs  Results for RANFERI, CLINGAN (MRN [id number]) as of 08/14/2014 08:59  Ref. Range 08/14/2014 05:53  Sodium Latest Ref Range: 135-145 mmol/L 130 (L)  Potassium Latest Ref Range: 3.5-5.1 mmol/L 4.3  Chloride Latest Ref Range: 96-112 mmol/L 99  CO2 Latest Ref Range: 19-32 mmol/L 22  BUN Latest Ref Range: 6-23 mg/dL 14  Creatinine Latest Ref Range: 0.50-1.35 mg/dL 1.18  Calcium Latest Ref Range: 8.4-10.5 mg/dL 7.8 (L)  EGFR (Non-African Amer.) Latest Ref Range: >90 mL/min 77 (L)  EGFR (African American) Latest Ref Range: >90 mL/min 89 (L)  Glucose Latest Ref Range: 70-99 mg/dL 161 (H)  Anion gap Latest Ref Range: 5-15  9  WBC Latest Ref Range: 4.0-10.5 K/uL 8.7  RBC Latest Ref Range: 4.22-5.81 MIL/uL 2.61 (L)  Hemoglobin Latest Ref Range: 13.0-17.0 g/dL 7.7 (L)  HCT Latest Ref Range: 39.0-52.0 % 21.4 (L)  MCV Latest Ref Range: 78.0-100.0 fL 82.0  MCH Latest Ref Range: 26.0-34.0 pg 29.5  MCHC Latest Ref Range: 30.0-36.0 g/dL 36.0  RDW Latest Ref Range: 11.5-15.5 % 13.4  Platelets Latest Ref Range: 150-400 K/uL 188    Exam  Gen: resting comfortably in bed,  NAD Lungs: clear anterior fields  Cardiac: tachy but regular   Abd: + BS, mild distention  Pelvis: dressings stable Ext:       Left Lower Extremity   Ext warm   + DP pulse  DPN, SPN, TN sensation intact   Dec LFCN sensation   + hip adduction  EHL, FHL, AT, PT, peroneals, gastroc motor intact  + Quad contraction  Ecchymosis stable     Assessment and Plan   POD/HD#: 51  39 year old white male s/p horse riding accident with a severely comminuted both column left acetabulum fracture   1. Horse riding accident   2. Both column left acetabulum fracture            s/p ORIF    TDWB x 8 weeks with graduated WB thereafter  No formal hip precautions  PT/OT evals    Ice prn  Elevate extremity for swelling control   3. Dental abscess             continue with clinda  4. Positive tox screen             Social work eval  5. Pain management:  continue with current regimen   6. ABL anemia/Hemodynamics             Acute blood loss anemia   Will transfuse 2 units PRBC's today   7. DVT/PE prophylaxis:             SCDs for now             Lovenox x 6 weeks  8. ID:               Clindamycin 300 mg po q8h   9. Metabolic Bone Disease:           Vitamin D insufficiency   Will check additional labs   Supplement vitamin D   10. Activity:             TDWB L leg  PT/OT evals   11. FEN/Foley/Lines:             Clears, likely advance later today or tomorrow   Continue with foley for another 24 hours    12.  Dispo:            therapy evals    Jari Pigg, PA-C [facility] [phone] (O) 08/14/2014 8:31 AM

## 2014-08-14 NOTE — Progress Notes (Signed)
UR completed.  Boleslaus Holloway, RN BSN MHA CCM Trauma/Neuro ICU Case Manager [phone]  

## 2014-08-14 NOTE — Op Note (Signed)
Kevin Pert:  [name], [name]                 ACCOUNT NO.:  [id number]  MEDICAL RECORD NO.:  [id number]  LOCATION:  [id number]                        FACILITY:  [facility]  PHYSICIAN:  Doralee AlbinoMichael H. [name], M.D. DATE OF BIRTH:  Aug 12, 1975  DATE OF PROCEDURE:  08/13/2014 DATE OF DISCHARGE:                              OPERATIVE REPORT   PREOPERATIVE DIAGNOSIS:  Left both column acetabular fracture.  POSTOPERATIVE DIAGNOSIS:  Left both column acetabular fracture.  PROCEDURE: 1. Open reduction internal fixation of left both column acetabular     fracture. 2. Removal of tibial traction pin.  SURGEON:  Doralee AlbinoMichael H. [name], M.D.  ASSISTANT:  Montez MoritaKeith Paul, PA-C.  ANESTHESIA:  General.  COMPLICATIONS:  None.  DRAINS:  None.  I/O:  500 mL colloid, 3200 mL crystalloid/EBL 400, UOP 380.  DISPOSITION:  To PACU.  CONDITION:  Stable.  BRIEF SUMMARY AND INDICATIONS FOR PROCEDURE:  [name] is a 39 year old male, who was riding his horse and was crushed in the left hip when the horse reared back, dumping the rider and then landing on top of him. He denied numbness or tingling in his leg and was placed into tibial traction by Dr. Dorene GrebeScott Dean.  Because of the magnitude and complexity of the injury, Dr. August Saucerean believed the patient would be best managed by a fellowship trained orthopedic traumatologist, deeming this outside his scope of practice.  Consequently, the Orthopedic Trauma Service was consulted.  Risks and benefits were discussed with the patient, including the possibility of arthritis, nerve injury, vessel injury, blood loss requiring transfusion, DVT, PE, heterotopic bone, no need for further surgery, malunion, nonunion, heart attack, stroke, and multiple others.  The patient acknowledged these risks and did wish to proceed with repair.  BRIEF SUMMARY OF PROCEDURE:  [name] was taken to the operating room, where general anesthesia was induced.  He did receive preoperative antibiotics.  The patient  was then transferred to the operative table. Traction was adjusted to assist with reduction and was maintained throughout.  I brought the patient's left hip into 45 degrees of flexion and also placed a bump under the left hip to assist with producing some pelvic tilt to allow for better access to the iliac wing. The patient underwent a standard prep and drape and then time-out.  We began with exposure of the left iliac wing through the superior window of the ilioinguinal carrying dissection through the muscular plane down to the brim and then performing subperiosteal dissection back to the fracture site.  This was packed and then attention turned to the anterior pubis.  Here a Pfannenstiel incision was made and dissection carried down toward the brim.  We noticed in the subcutaneous tissues, a hemorrhage and in fact the patient had avulsed the left side of his abdominal muscles and bleached.  The femoral sheath was identified and protected laterally.  The spermatic cord  likewise was released from the external oblique aponeurosis and mobilized and protected throughout that had no stressor pulled upon it.  This was followed by a continuation and dissection down of the pelvic brim.  The linea alba was identified and split vertically after removing the remaining fibers of the left  rectus. The full-thickness rectus was tagged on both sides for easy repair following exposure and repair of the fracture.  I went around the brim, where identified a corona mortise vessel linking the obturator and epigastric system.  This was mobilized freely and then secured with 2 clips on either side and dividing it in the middle.  Subperiosteal dissection continued to the fracture site and pulse lavage was used to clean out the fracture site.  There was also a torn labrum within the dome and this was cut and removed with a 10 blade.  I did not see any large femoral heads callus.  I then turned my attention back  to the iliac wing, where a Schanz pin was placed between the tables to assist with mobilization.  The fracture site was reduced along the iliac wing and secured with a lag screw with the help of my assistant, Montez Morita, who produced reduction by manipulating the Schanz pin and also provided for retraction and exposure.  This is followed by placement of a 6-hole plate with 2 screws on either side of the fracture.  We could see the fracture interdigitate quite nicely.  I then went back into the Stoppa window, where I used the exposure along the pelvic brim and the eminence to produce a reduction, where again my assistant used the joystick in the iliac wing, in addition to the Cobra along the brim.  I was careful to avoid any excessive traction to either the spermatic cord or femoral vessels.  This was followed by placement of 2 screws into the anterior column, 1 anterior, 1 more posterior, and then with use of the ball- spike pusher, additional screws were placed and lastly, I did lag the posterior to the anterior column using the ball-spike pusher along the sciatic buttress to further produce reduction.  All screws were tightened sequentially, which helped quite nicely to sequentially improve the reduction and compression of the fracture site.  Final images were obtained after placement of remaining hardware consisting of AP, inlets and outlets.  There were no complications during the procedure.  Montez Morita, PA-C was absolutely necessary for its safe and effective completion and it could not have been completed without an assistant.  Malleable was used to protect the bladder at all times.  The spermatic cord was re-incase back in the external oblique fascia nicely and loosely and then the brim repaired with multiple #1 figure-of-eight Vicryl, and then a deep 0 Vicryl with deep fat over this stout repair and then 2-0 Vicryl, 3-0 nylon.  Similarly 2-0 and 3-0 were used for the superior  window of the ilioinguinal.  The patient was awakened from anesthesia and transported to the PACU in stable condition.  PROGNOSIS:  The patient will be touchdown weightbearing with hip precautions and formal DVT prophylaxis with Lovenox and this be continued for 10-20 days after his discharge.  He will not require heterotopic ossification prophylaxis with XRT because of the anterior approach.  He is certainly at elevated risk for arthritis and loss of motion, given the magnitude of injury and severity of his fracture.     Doralee Albino. Carola Frost, M.D.     MHH/MEDQ  D:  08/13/2014  T:  08/14/2014  Job:  [id number]

## 2014-08-15 DIAGNOSIS — S3022XS Contusion of scrotum and testes, sequela: Secondary | ICD-10-CM

## 2014-08-15 DIAGNOSIS — D62 Acute posthemorrhagic anemia: Secondary | ICD-10-CM

## 2014-08-15 DIAGNOSIS — S329XXS Fracture of unspecified parts of lumbosacral spine and pelvis, sequela: Secondary | ICD-10-CM

## 2014-08-15 LAB — COMPREHENSIVE METABOLIC PANEL
ALK PHOS: 44 U/L (ref 39–117)
ALT: 40 U/L (ref 0–53)
AST: 72 U/L — AB (ref 0–37)
Albumin: 2.6 g/dL — ABNORMAL LOW (ref 3.5–5.2)
Anion gap: 8 (ref 5–15)
BUN: 8 mg/dL (ref 6–23)
CHLORIDE: 97 mmol/L (ref 96–112)
CO2: 26 mmol/L (ref 19–32)
CREATININE: 1.1 mg/dL (ref 0.50–1.35)
Calcium: 8 mg/dL — ABNORMAL LOW (ref 8.4–10.5)
GFR calc Af Amer: >90 mL/min (ref >90)
GFR, EST NON AFRICAN AMERICAN: 84 mL/min — AB (ref >90)
Glucose, Bld: 120 mg/dL — ABNORMAL HIGH (ref 70–99)
POTASSIUM: 3.5 mmol/L (ref 3.5–5.1)
SODIUM: 131 mmol/L — AB (ref 135–145)
Total Bilirubin: 0.9 mg/dL (ref 0.3–1.2)
Total Protein: 5.1 g/dL — ABNORMAL LOW (ref 6.0–8.3)

## 2014-08-15 LAB — TYPE AND SCREEN
ABO/RH(D): A POS
Antibody Screen: NEGATIVE
UNIT DIVISION: 0
UNIT DIVISION: 0

## 2014-08-15 LAB — PHOSPHORUS: Phosphorus: 2.2 mg/dL — ABNORMAL LOW (ref 2.3–4.6)

## 2014-08-15 LAB — TSH: TSH: 1.717 u[IU]/mL (ref 0.350–4.500)

## 2014-08-15 LAB — MAGNESIUM: Magnesium: 1.9 mg/dL (ref 1.5–2.5)

## 2014-08-15 LAB — CBC
HEMATOCRIT: 22.6 % — AB (ref 39.0–52.0)
Hemoglobin: 7.9 g/dL — ABNORMAL LOW (ref 13.0–17.0)
MCH: 29.3 pg (ref 26.0–34.0)
MCHC: 35 g/dL (ref 30.0–36.0)
MCV: 83.7 fL (ref 78.0–100.0)
PLATELETS: 209 10*3/uL (ref 150–400)
RBC: 2.7 MIL/uL — ABNORMAL LOW (ref 4.22–5.81)
RDW: 14.1 % (ref 11.5–15.5)
WBC: 8.7 10*3/uL (ref 4.0–10.5)

## 2014-08-15 MED ORDER — TRAMADOL HCL 50 MG PO TABS
100.0000 mg | ORAL_TABLET | Freq: Four times a day (QID) | ORAL | Status: DC
Start: 2014-08-15 — End: 2014-08-17
  Administered 2014-08-15 – 2014-08-17 (×8): 100 mg via ORAL
  Filled 2014-08-15 (×8): qty 2

## 2014-08-15 MED ORDER — METOCLOPRAMIDE HCL 5 MG/ML IJ SOLN
10.0000 mg | Freq: Three times a day (TID) | INTRAMUSCULAR | Status: DC
  Administered 2014-08-15 – 2014-08-16 (×4): 10 mg via INTRAVENOUS
  Filled 2014-08-15 (×3): qty 2

## 2014-08-15 NOTE — Clinical Social Work Note (Signed)
Clinical Social Work Assessment  Patient Details  Name: [name] MRN: [id number] Date of Birth: 05-03-1975  Date of referral:  08/15/14               Reason for consult:  Substance Use/ETOH Abuse                Permission sought to share information with:  Other (No need for CSW to share patient's information) Permission granted to share information::  No  Name::        Agency::     Relationship::     Contact Information:     Housing/Transportation Living arrangements for the past 2 months:  Single Family Home Source of Information:  Patient Patient Interpreter Needed:  None Criminal Activity/Legal Involvement Pertinent to Current Situation/Hospitalization:  No - Comment as needed Significant Relationships:  Parents, Siblings Lives with:  Self, Other (Comment) (Patient lives by himself but will me going to his mom's at discharge.) Do you feel safe going back to the place where you live?    Need for family participation in patient care:  No (Coment)  Care giving concerns:  Patient does not report any concerns at this time.   Social Worker assessment / plan:  Patient plans to DC to inpatient rehab once medically stable. The patient reports that he has a very support mom and sister. His sister is a Engineer, civil (consulting)nurse with [employer]. Patient states that he was admitted after his horse fell on him. The patient denies any symptoms of acute stress reaction. CSW inquired about patient's substance use to complete SBIRT. Patient reports that he "occassionally" drinks alcohol, smoke pot regularly, and "the cocaine was just something that happened at a party." The patient doesn't view his substance use as problematic and refuses substance abuse resources that were offered. The patient does not attribute his accident to his substance use. Patient appears to be in the pre-contemplative stage of change as he doesn't see any problem with his illicit drug use and has no intentions of stopping. Patient will DC  to CIR and return home with mom where he feels safe when able.   Employment status:    Health and safety inspectornsurance information:  Managed Care PT Recommendations:  Inpatient Rehab Consult Information / Referral to community resources:  SBIRT, Other (Comment Required) (Substance abuse resources offered but patient declined)  Patient/Family's Response to care:  Patient states he is happy with the care he has received. He reports looking forward to improving and being able to go home.  Patient/Family's Understanding of and Emotional Response to Diagnosis, Current Treatment, and Prognosis:  Patient has good insight into the reason for his admission. Patient has a good understanding of the extent of his care needs post discharge.  Emotional Assessment Appearance:  Appears stated age Attitude/Demeanor/Rapport:  Other (Patient was appropriate.) Affect (typically observed):  Accepting, Calm, Pleasant, Stable Orientation:  Oriented to Self, Oriented to Place, Oriented to  Time, Oriented to Situation Alcohol / Substance use:  Illicit Drugs, Alcohol Use Psych involvement (Current and /or in the community):  No (Comment)  Discharge Needs  Concerns to be addressed:  Discharge Planning Concerns, Substance Abuse Concerns Readmission within the last 30 days:  No Current discharge risk:  Substance Abuse, Physical Impairment Barriers to Discharge:  Continued Medical Work up   The KrogerBryant Kayna Suppa MSW, [place]WA, [place], [phone]

## 2014-08-15 NOTE — Progress Notes (Signed)
Physical Therapy Treatment Patient Details Name: [name] MRN: [id number] DOB: [birth year] Today's Date: 08/15/2014    History of Present Illness 39 yo male s/p Multiple pelvis fxs s/p ORIF(TDWB LLE) after falling off horse     PT Comments    Pt agitated this session and poor adherence to verbal cues and WB status during stand pivot transfer.  Pt limited this session by severe pain in testicles which prevent pt to perform stand pivot transfer safely at this time.  Pt will benefit from continued skilled PT services to increase functional independence and safety.      Follow Up Recommendations  CIR;Supervision for mobility/OOB     Equipment Recommendations  3in1 (PT)    Recommendations for Other Services Rehab consult     Precautions / Restrictions Precautions Precautions: Fall Restrictions Weight Bearing Restrictions: Yes LLE Weight Bearing: Touchdown weight bearing    Mobility  Bed Mobility Overal bed mobility: Needs Assistance Bed Mobility: Supine to Sit     Supine to sit: Min assist;HOB elevated     General bed mobility comments: min A for managing RLE to get to sitting EOB, heavy use of bed rails, HOB elevated  Transfers Overall transfer level: Needs assistance Equipment used: Rolling walker (2 wheeled) Transfers: Sit to/from [employer] Sit to Stand: Mod assist;+2 safety/equipment;From elevated surface Stand pivot transfers: Min assist;+2 safety/equipment       General transfer comment: During stand pivot transfer pt was not adhering to WB status and required min A, pt continued to rest R forearm on walker and shuffle RLE to turn despite verbal cues for hands on RW and to push through BLEs.  Cues for hand placement (pt refused and wanted to pull on bed rails rather than push), cues for adhering to WB status  Ambulation/Gait             General Gait Details: pivotal steps only   Stairs            Wheelchair Mobility     Modified Rankin (Stroke Patients Only)       Balance Overall balance assessment: Needs assistance Sitting-balance support: Bilateral upper extremity supported;Feet supported Sitting balance-Leahy Scale: Poor     Standing balance support: Bilateral upper extremity supported;During functional activity Standing balance-Leahy Scale: Poor                      Cognition Arousal/Alertness: Awake/alert Behavior During Therapy: Agitated Overall Cognitive Status: Within Functional Limits for tasks assessed                      Exercises Total Joint Exercises Ankle Circles/Pumps: AROM;Both;10 reps;Seated Long Arc Quad: AROM;Left;10 reps;Seated    General Comments        Pertinent Vitals/Pain Pain Assessment: 0-10 Pain Score: 5  Pain Location: testicles Pain Descriptors / Indicators: Constant;Aching Pain Intervention(s): Limited activity within patient's tolerance;Monitored during session;Repositioned    Home Living                      Prior Function            PT Goals (current goals can now be found in the care plan section) Acute Rehab PT Goals Patient Stated Goal: decrease pain Progress towards PT goals: Progressing toward goals    Frequency  Min 5X/week    PT Plan Current plan remains appropriate    Co-evaluation  End of Session Equipment Utilized During Treatment: Gait belt Activity Tolerance: Patient limited by pain;Treatment limited secondary to agitation Patient left: in chair;with call bell/phone within reach;with family/visitor present     Time: 1610-96041107-1123 PT Time Calculation (min) (ACUTE ONLY): 16 min  Charges:  $Therapeutic Activity: 8-22 mins                    G Codes:      [name] PT, [place]DPT [phone] Pager: [phone] 08/15/2014, 12:19 PM

## 2014-08-15 NOTE — Progress Notes (Signed)
Patient ID: [name], male   DOB: 10-18-1975, 39 y.o.   MRN: [id number]   LOS: 3 days   Subjective: Has productive cough that's causing a lot of discomfort. Denies N/V.   Objective: Vital signs in last 24 hours: Temp:  [98 F (36.7 C)-99.8 F (37.7 C)] 98.1 F (36.7 C) (04/28 0531) Pulse Rate:  [101-113] 113 (04/28 0531) Resp:  [16-24] 22 (04/28 0531) BP: (111-148)/(61-88) 113/75 mmHg (04/28 0531) SpO2:  [92 %-98 %] 93 % (04/28 0531) Last BM Date: 08/11/14   Laboratory  CBC  Recent Labs  08/14/14 0553 08/15/14 0600  WBC 8.7 8.7  HGB 7.7* 7.9*  HCT 21.4* 22.6*  PLT 188 209   BMET  Recent Labs  08/14/14 0553 08/15/14 0600  NA 130* 131*  K 4.3 3.5  CL 99 97  CO2 22 26  GLUCOSE 161* 120*  BUN 14 8  CREATININE 1.18 1.10  CALCIUM 7.8* 8.0*    Physical Exam General appearance: alert and mild distress Resp: clear to auscultation bilaterally Cardio: regular rate and rhythm GI: normal findings: soft, non-tender and diminished BS Extremities: NVI   Assessment/Plan: Fall from horse Multiple pelvis fxs s/p ORIF -- TDWB LLE per Dr. [name] ABL anemia -- Essentially unchanged after 2 units, likely equilibration, check tomorrow FEN -- Add tramadol VTE -- SCD's, Lovenox  Dispo -- PT/OT, CIR consult    Freeman CaldronMichael J. Chivonne Rascon, PA-C Pager: [phone] General Trauma PA Pager: [phone]  08/15/2014

## 2014-08-15 NOTE — Consult Note (Signed)
Physical Medicine and Rehabilitation Consult   Reason for Consult: Left acetabular fracture Referring Physician: [name].    HPI: [name] is a 39 y.o. male who was admitted on 08/11/14 after being thrown off his horse which subsequently landed on top of him.  UDS positive for cocaine and THC.  Patient was found to have severely comminuted both column acetabulum fracture and was placed in traction by Dr. August Saucerean.  He was evaluated by Dr [name] and underwent ORIF left both column acetabular fracture with removal of tibial pin. Post op TDWB LLE X 12 weeks with recommendations for lovenox X 20 days followed by ASA.  ABLA treated with 2 units PRBC. Patient has had issues with scrotal pain as well as abdominal discomfort due to ileus and diet modified to liquids.  PT/OT evaluations done yesterday and CIR recommended for follow up therapy.    Patient states that he has difficulty sitting in a chair because it causes excess pressure on his scrotum. His main complaint in terms of pain is his scrotum.  Review of Systems  HENT: Negative for hearing loss.   Eyes: Negative for blurred vision and double vision.  Respiratory: Positive for shortness of breath (due to pain).   Cardiovascular: Negative for chest pain and palpitations.  Gastrointestinal: Positive for heartburn, abdominal pain and constipation. Negative for nausea.  Musculoskeletal: Positive for myalgias and back pain.  Neurological: Positive for weakness. Negative for dizziness, tingling and headaches.      Past Medical History  Diagnosis Date  . History of chicken pox   . History of kidney stones     Past Surgical History  Procedure Laterality Date  . Tonsillectomy    . Orif acetabular fracture Left 08/13/2014    Procedure: LEFT OPEN REDUCTION INTERNAL FIXATION (ORIF) ACETABULAR FRACTURE;  Surgeon: [name], MD;  [facility];  Service: Orthopedics;  Laterality: Left;    Family History  Problem Relation Age of  Onset  . Cancer Mother     ? cancer. Lymph nodes removed from breast, uterine tumor?  . Cancer Maternal Grandfather     prostate. Dx in his late 9070s  . Stroke Paternal Grandmother   . Heart disease Paternal Grandmother     cardiac stent ? valve replacement  . Cancer Paternal Grandfather     lung  . Pneumonia Paternal Grandfather     Social History:  Lives alone but plans on d/c to mother's home.  Mother works days. He works in [employer]. He reports that he quit smoking about 10 years ago. He does not have any smokeless tobacco history on file. He reports that he drinks alcohol on the weekends. . H/o of polysubstance abuse but reports that he quit use of marijuana, pain pills, and cocaine in 2009.    Allergies  Allergen Reactions  . Penicillins Hives    REACTION: childhood reaction    Medications Prior to Admission  Medication Sig Dispense Refill  . benzocaine (ORAJEL) 10 % mucosal gel Use as directed 1 application in the mouth or throat daily as needed for mouth pain.      Home: Home Living Family/patient expects to be discharged to:: Private residence (to mother's house at d/c) Living Arrangements: Parent Available Help at Discharge: Family, Available PRN/intermittently (mom and family works, but reports he knows people that can c) Type of Home: House Home Access: Stairs to enter Secretary/administratorntrance Stairs-Number of Steps: 3 Entrance Stairs-Rails: Can reach both Home Layout: One level  Home Equipment: None (Pt has ordered RW and manual WC to be at home upon d/c) Additional Comments: Above information is based on mother's house, patient states he plans to discharge there  Functional History: Prior Function Level of Independence: Independent Functional Status:  Mobility: Bed Mobility Overal bed mobility: Needs Assistance Bed Mobility: Rolling, Sidelying to Sit Rolling: Min assist Sidelying to sit: Min assist, HOB elevated General bed mobility comments: in  recliner Transfers Overall transfer level: Needs assistance Equipment used: Rolling walker (2 wheeled) Transfers: Sit to/from Stand Sit to Stand: Min assist, From elevated surface (pillows on recliner to elevate surface) General transfer comment: Cues to push from reciner rather than pull on RW.  Cues to bring LLE out in front to adhere to WB precautions.  Pt w/ increased pain in testicles while standing. Ambulation/Gait General Gait Details: not attempted this session    ADL: ADL Overall ADL's : Needs assistance/impaired Eating/Feeding: Set up, Bed level Grooming: Set up, Bed level Upper Body Bathing: Minimal assitance, Sitting Lower Body Bathing: Total assistance, Sitting/lateral leans, Cueing for safety Upper Body Dressing : Minimal assistance, Sitting Lower Body Dressing: Total assistance, Sitting/lateral leans, Cueing for safety General ADL Comments: Patient engaged in bed mobility (heavily relying on bed rails) and sat EOB. Patient min assist without UE support seated EOB (RLE supported). Patient able to perform sit<>stand with min assist and cues for TDWB > LLE. Patient with increased pain in testicles during bed mobility and sit<>stand transfers. Patient attempted stand pivot transfer with RW, but unable due to pain and WB restricitions. Therefore, patient performed squat pivot from bed > recliner > right side. Notified RN of needed pain medication and educated patient on elevation of LLE and testicles to help decrease pain.   Cognition: Cognition Overall Cognitive Status: Within Functional Limits for tasks assessed Orientation Level: Oriented X4 Cognition Arousal/Alertness: Awake/alert Behavior During Therapy: WFL for tasks assessed/performed Overall Cognitive Status: Within Functional Limits for tasks assessed  Blood pressure 113/75, pulse 113, temperature 98.1 F (36.7 C), temperature source Oral, resp. rate 22, height  (1.651 m), weight 90.7 kg (199 lb 15.3 oz), SpO2  93 %. Physical Exam  Nursing note and vitals reviewed. Constitutional: He is oriented to person, place, and time. He appears well-developed and well-nourished.  Obese male up in chair.  Panting due to pain  HENT:  Head: Normocephalic and atraumatic.  Eyes: Conjunctivae are normal. Pupils are equal, round, and reactive to light.  Neck: Normal range of motion. Neck supple.  Cardiovascular: Regular rhythm.  Tachycardia present.   Respiratory: Effort normal. No accessory muscle usage. Tachypnea noted. No respiratory distress. He has decreased breath sounds.  GI: He exhibits distension. Bowel sounds are decreased. There is tenderness.  Genitourinary:  3+ scrotal edema with ecchymosis and tenderness with movement  Musculoskeletal:  Min edema LLE. BLE limited due to pain  Neurological: He is alert and oriented to person, place, and time.  Skin: Skin is warm and dry.  Psychiatric: His speech is normal. His mood appears anxious. He is withdrawn.  5/5 strength in bilateral deltoid, biceps, triceps, grip 4/5 right hip flexor and knee extensor ankle dorsi flexor plantar flexor Trace left hip flexor and extensor and dorsiflexor plantar flexor pain limited Has tenderness palpation over the left hip area extensive ecchymosis in the left hip and left thigh. Tenderness to palpation in the left thigh  Results for orders placed or performed during the hospital encounter of 08/11/14 (from the past 24 hour(s))  CBC  Status: Abnormal   Collection Time: 08/15/14  6:00 AM  Result Value Ref Range   WBC 8.7 4.0 - 10.5 K/uL   RBC 2.70 (L) 4.22 - 5.81 MIL/uL   Hemoglobin 7.9 (L) 13.0 - 17.0 g/dL   HCT 06.3 (L) 01.6 - 01.0 %   MCV 83.7 78.0 - 100.0 fL   MCH 29.3 26.0 - 34.0 pg   MCHC 35.0 30.0 - 36.0 g/dL   RDW 93.2 35.5 - 73.2 %   Platelets 209 150 - 400 K/uL  TSH     Status: None   Collection Time: 08/15/14  6:00 AM  Result Value Ref Range   TSH 1.717 0.350 - 4.500 uIU/mL  Magnesium     Status: None    Collection Time: 08/15/14  6:00 AM  Result Value Ref Range   Magnesium 1.9 1.5 - 2.5 mg/dL  Phosphorus     Status: Abnormal   Collection Time: 08/15/14  6:00 AM  Result Value Ref Range   Phosphorus 2.2 (L) 2.3 - 4.6 mg/dL  Comprehensive metabolic panel     Status: Abnormal   Collection Time: 08/15/14  6:00 AM  Result Value Ref Range   Sodium 131 (L) 135 - 145 mmol/L   Potassium 3.5 3.5 - 5.1 mmol/L   Chloride 97 96 - 112 mmol/L   CO2 26 19 - 32 mmol/L   Glucose, Bld 120 (H) 70 - 99 mg/dL   BUN 8 6 - 23 mg/dL   Creatinine, Ser 2.02 0.50 - 1.35 mg/dL   Calcium 8.0 (L) 8.4 - 10.5 mg/dL   Total Protein 5.1 (L) 6.0 - 8.3 g/dL   Albumin 2.6 (L) 3.5 - 5.2 g/dL   AST 72 (H) 0 - 37 U/L   ALT 40 0 - 53 U/L   Alkaline Phosphatase 44 39 - 117 U/L   Total Bilirubin 0.9 0.3 - 1.2 mg/dL   GFR calc non Af Amer 84 (L) >90 mL/min   GFR calc Af Amer >90 >90 mL/min   Anion gap 8 5 - 15   Dg Pelvis Comp Min 3v  08/13/2014   CLINICAL DATA:  Left pelvic fractures, operative fixation, acetabular fracture.  EXAM: JUDET PELVIS - 3+ VIEW  COMPARISON:  08/12/2014  FINDINGS: Plate and screw fixation present of the left iliac and acetabular fractures with improved alignment. Right hemipelvis and hips appear intact. SI joints are symmetric. No complicating feature.  IMPRESSION: Improved alignment following ORIF of the left pelvic / acetabular fractures.   Electronically Signed   By: Judie Petit.  Shick M.D.   On: 08/13/2014 21:00   Dg Hip Operative Unilat With Pelvis Left  08/13/2014   CLINICAL DATA:  ORIF left hip fracture  EXAM: OPERATIVE LEFT HIP (WITH PELVIS IF PERFORMED) 7 VIEWS  TECHNIQUE: Fluoroscopic spot image(s) were submitted for interpretation post-operatively.  FLUOROSCOPY TIME:  If the device does not provide the exposure index:  Fluoroscopy Time:  20secs  Number of Acquired Images:  7  COMPARISON:  None.  FINDINGS: Multiple intraoperative fluoroscopic spot images are provided. There has been interval  placement of 2 malleable plates transfixing a left acetabular fracture. There is a malleable plate transfixing a left ilium fracture.  IMPRESSION: ORIF left acetabular and left ilium fracture.   Electronically Signed   By: Elige Ko   On: 08/13/2014 18:35    Assessment/Plan: Diagnosis: Left acetabular fracture status post left ORIF 08/13/2014 with touchdown weightbearing left lower extremity. 1. Does the need for close, 24 hr/day medical  supervision in concert with the patient's rehab needs make it unreasonable for this patient to be served in a less intensive setting? Yes 2. Co-Morbidities requiring supervision/potential complications: Ileus, scrotal hematoma, left thigh pain, acute blood loss anemia 3. Due to bladder management, bowel management, safety, skin/wound care, disease management, medication administration, pain management and patient education, does the patient require 24 hr/day rehab nursing? Yes 4. Does the patient require coordinated care of a physician, rehab nurse, PT (1-2 hrs/day, 5 days/week) and OT (112 hrs/day, 5 days/week) to address physical and functional deficits in the context of the above medical diagnosis(es)? Yes Addressing deficits in the following areas: balance, endurance, locomotion, strength, transferring, bowel/bladder control, bathing, dressing, feeding, grooming and toileting 5. Can the patient actively participate in an intensive therapy program of at least 3 hrs of therapy per day at least 5 days per week? not currently but expect he should be able to tolerate in the next 1-2 days 6. The potential for patient to make measurable gains while on inpatient rehab is good 7. Anticipated functional outcomes upon discharge from inpatient rehab are modified independent and supervision  with PT, modified independent and supervision with OT, n/a with SLP. 8. Estimated rehab length of stay to reach the above functional goals is: 12-16 days 9. Does the patient have  adequate social supports and living environment to accommodate these discharge functional goals? Yes 10. Anticipated D/C setting: Home 11. Anticipated post D/C treatments: HH therapy 12. Overall Rehab/Functional Prognosis: excellent  RECOMMENDATIONS: This patient's condition is appropriate for continued rehabilitative care in the following setting: CIR Patient has agreed to participate in recommended program. YES Note that insurance prior authorization may be required for reimbursement for recommended care.  Comment: Patient has pain to palpation in the left thigh area, would recommend left thigh x-ray    08/15/2014

## 2014-08-15 NOTE — Progress Notes (Signed)
Rehab Admissions Coordinator Note:  Patient was screened by Clois DupesBoyette, Robbert Langlinais Godwin for appropriateness for an Inpatient Acute Rehab Consult per PT and OT recommendations.  At this time, we are recommending Inpatient Rehab consult. Please place order.   Clois DupesBoyette, Len Kluver Godwin 08/15/2014, 9:01 AM  I can be reached at [phone].

## 2014-08-15 NOTE — Progress Notes (Signed)
Orthopaedic Trauma Service (OTS)  Subjective: 2 Days Post-Op Procedure(s) (LRB): LEFT OPEN REDUCTION INTERNAL FIXATION (ORIF) ACETABULAR FRACTURE (Left) Patient reports pain as mild and moderate.   C/o satchel swelling, abdominal distension, but is passing small amount of flatus.  Objective: Current Vitals Blood pressure 113/75, pulse 113, temperature 98.1 F (36.7 C), temperature source Oral, resp. rate 22, height 5\' 5"  (1.651 m), weight 199 lb 15.3 oz (90.7 kg), SpO2 93 %. Vital signs in last 24 hours: Temp:  [98 F (36.7 C)-99.8 F (37.7 C)] 98.1 F (36.7 C) (04/28 0531) Pulse Rate:  [101-113] 113 (04/28 0531) Resp:  [16-24] 22 (04/28 0531) BP: (111-148)/(61-88) 113/75 mmHg (04/28 0531) SpO2:  [92 %-98 %] 93 % (04/28 0531)  Intake/Output from previous day: 04/27 0701 - 04/28 0700 In: 1555 [P.O.:1220; Blood:335] Out: 2200 [Urine:2200]  LABS  Recent Labs  08/12/14 1013 08/13/14 0647 08/14/14 0553 08/15/14 0600  HGB 11.8* 11.2* 7.7* 7.9*    Recent Labs  08/14/14 0553 08/15/14 0600  WBC 8.7 8.7  RBC 2.61* 2.70*  HCT 21.4* 22.6*  PLT 188 209    Recent Labs  08/14/14 0553 08/15/14 0600  NA 130* 131*  K 4.3 3.5  CL 99 97  CO2 22 26  BUN 14 8  CREATININE 1.18 1.10  GLUCOSE 161* 120*  CALCIUM 7.8* 8.0*   No results for input(s): LABPT, INR in the last 72 hours.  Physical Exam  Scrotal swelling and eccyhmosis, but not severe Intact sensory and motor of bilateral lower extremities; swelling well controlled there Mild serosang drainage on drsgs   Assessment/Plan: 2 Days Post-Op Procedure(s) (LRB): LEFT OPEN REDUCTION INTERNAL FIXATION (ORIF) ACETABULAR FRACTURE (Left) Both column acetabular fracture  TDWB Cl[place] for dental abscess Lovenox for at least 20 days post discharge then aspirin Slight ilieus with patient switching to liquids primarily on his own today, MOM tonight if no product   Myrene GalasMichael Luster Hechler, MD [facility]  [facility] [phone] (p)   08/15/2014, 8:30 AM

## 2014-08-16 LAB — CBC
HCT: 21.2 % — ABNORMAL LOW (ref 39.0–52.0)
HEMOGLOBIN: 7.2 g/dL — AB (ref 13.0–17.0)
MCH: 28.5 pg (ref 26.0–34.0)
MCHC: 34 g/dL (ref 30.0–36.0)
MCV: 83.8 fL (ref 78.0–100.0)
Platelets: 210 10*3/uL (ref 150–400)
RBC: 2.53 MIL/uL — ABNORMAL LOW (ref 4.22–5.81)
RDW: 14.2 % (ref 11.5–15.5)
WBC: 7.5 10*3/uL (ref 4.0–10.5)

## 2014-08-16 LAB — PTH, INTACT AND CALCIUM
CALCIUM TOTAL (PTH): 7.9 mg/dL — AB (ref 8.7–10.2)
PTH: 55 pg/mL (ref 15–65)

## 2014-08-16 LAB — PREALBUMIN: PREALBUMIN: 12 mg/dL — AB (ref 21–43)

## 2014-08-16 LAB — EXPECTORATED SPUTUM ASSESSMENT W REFEX TO RESP CULTURE

## 2014-08-16 LAB — TRANSFERRIN: Transferrin: 187 mg/dL — ABNORMAL LOW (ref 200–370)

## 2014-08-16 LAB — HEMOGLOBIN A1C
Hgb A1c MFr Bld: 5.7 % — ABNORMAL HIGH (ref 4.8–5.6)
MEAN PLASMA GLUCOSE: 117 mg/dL

## 2014-08-16 NOTE — Clinical Social Work Placement (Signed)
   CLINICAL SOCIAL WORK PLACEMENT  NOTE  Date:  08/16/2014  Patient Details  Name: [name] Aure MRN: [id number] Date of Birth: 04/24/1975  Clinical Social Work is seeking post-discharge placement for this patient at the Skilled  Nursing Facility level of care (*CSW will initial, date and re-position this form in  chart as items are completed):  Yes   Patient/family provided with [place] Clinical Social Work Department's list of facilities offering this level of care within the geographic area requested by the patient (or if unable, by the patient's family).  Yes   Patient/family informed of their freedom to choose among providers that offer the needed level of care, that participate in Medicare, Medicaid or managed care program needed by the patient, have an available bed and are willing to accept the patient.  Yes   Patient/family informed of [facility] [place] [facility]'s ownership interest in [facility] and [facility], as well as of the fact that they are under no obligation to receive care at these facilities.  PASRR submitted to EDS on 08/16/14     PASRR number received on 08/16/14     Existing PASRR number confirmed on       FL2 transmitted to all facilities in geographic area requested by pt/family on 08/16/14     FL2 transmitted to all facilities within larger geographic area on       Patient informed that his/her managed care company has contracts with or will negotiate with certain facilities, including the following:            Patient/family informed of bed offers received.  Patient chooses bed at       Physician recommends and patient chooses bed at      Patient to be transferred to   on  .  Patient to be transferred to facility by       Patient family notified on   of transfer.  Name of family member notified:        PHYSICIAN       Additional Comment:    _______________________________________________ Legrand ComoScinto, Heer Justiss Lynn, LCSW 08/16/2014, 5:12 PM

## 2014-08-16 NOTE — Progress Notes (Signed)
Occupational Therapy Treatment Patient Details Name: WILLAM MUNFORD MRN: [id number] DOB: 03-18-1976 Today's Date: 08/16/2014    History of present illness 39 yo male s/p Multiple pelvis fxs s/p ORIF(TDWB LLE) after falling off horse    OT comments  Pt. Continues with scrotum pain but agreeable to participation in skilled OT.  Able to transition eob this session and into standing while maintaining wbs.    Requires verbal and physical assist x 2 for safety and technique.   Able to complete approx. 3 side/shimmy type steps towards hob prior to back to bed.  Motivated to progress and eager to continue with CIR level therapies.  Follow Up Recommendations  CIR;Supervision/Assistance - 24 hour    Equipment Recommendations       Recommendations for Other Services Rehab consult    Precautions / Restrictions Precautions Precautions: Fall Restrictions LLE Weight Bearing: Touchdown weight bearing       Mobility Bed Mobility Overal bed mobility: Needs Assistance Bed Mobility: Supine to Sit;Sit to Supine     Supine to sit: Min assist;HOB elevated Sit to supine: Min assist;HOB elevated   General bed mobility comments: min A for managing LLE and  RLE to get to sitting EOB, heavy use of bed rails, HOB elevated  Transfers Overall transfer level: Needs assistance Equipment used: Rolling walker (2 wheeled) Transfers: Sit to/from Stand Sit to Stand: Min assist;+2 safety/equipment         General transfer comment: pt. able to maintain wbs while transitioning into standing.  cues for walker management during shimy steps/side steps towards hob.  completed approx. 3 steps towards hob prior to back to bed    Balance                                   ADL                                         General ADL Comments: pt. agreeable to light bed mobility, eob, and sit/stand (refer to mobility section for details)  remains limited due to continuous c/o scrotum  pain      Vision                     Perception     Praxis      Cognition   Behavior During Therapy: Agitated Overall Cognitive Status: Within Functional Limits for tasks assessed                       Extremity/Trunk Assessment               Exercises     Shoulder Instructions       General Comments      Pertinent Vitals/ Pain       Pain Assessment: 0-10 Pain Score: 7  Pain Location: testicels Pain Descriptors / Indicators: Constant;Aching Pain Intervention(s): Monitored during session;Premedicated before session;Repositioned;Ice applied  Home Living                                          Prior Functioning/Environment              Frequency Min 2X/week     Progress Toward  Goals  OT Goals(current goals can now be found in the care plan section)  Progress towards OT goals: Progressing toward goals     Plan Discharge plan remains appropriate    Co-evaluation                 End of Session Equipment Utilized During Treatment: Gait belt;Rolling walker   Activity Tolerance Patient limited by pain   Patient Left in bed;with call bell/phone within reach   Nurse Communication          Time: [id number] OT Time Calculation (min): 12 min  Charges: OT General Charges $OT Visit: 1 Procedure OT Treatments $Self Care/Home Management : 8-22 mins  Robet LeuMorris, Aveah Castell [place]raine, COTA/L 08/16/2014, 10:06 AM

## 2014-08-16 NOTE — Progress Notes (Signed)
Physical Therapy Treatment Patient Details Name: [name] MRN: [id number] DOB: 08-11-1975 Today's Date: 08/16/2014    History of Present Illness 39 yo male s/p Multiple pelvis fxs s/p ORIF(TDWB LLE) after falling off horse     PT Comments    Patient is making progress w/ PT and completed stand pivot transfer this session.  Pt's L hip resting in ER upon arrival and emphasized to pt the importance of keeping his hip in neutral w/ "toes pointed toward the ceiling", pt verbalized understanding and pillow placed to prop LLE in neutral rotation at end of session.  Pt is willing to work w/ therapy and expresses desire to go to inpatient rehab to increase his functional independence.  Pt's pain has improved as well as his agitation which allowed him to improve this session.     Follow Up Recommendations  CIR;Supervision for mobility/OOB     Equipment Recommendations  3in1 (PT)    Recommendations for Other Services Rehab consult     Precautions / Restrictions Precautions Precautions: Fall Restrictions Weight Bearing Restrictions: Yes LLE Weight Bearing: Touchdown weight bearing    Mobility  Bed Mobility Overal bed mobility: Needs Assistance Bed Mobility: Supine to Sit     Supine to sit: Min assist;HOB elevated Sit to supine: Min assist;HOB elevated   [name] bed mobility comments: min A for managing LLE and  RLE to get to sitting EOB, heavy use of bed rails, HOB elevated  Transfers Overall transfer level: Needs assistance Equipment used: Rolling walker (2 wheeled) Transfers: Sit to/from [employer] Sit to Stand: Min assist;+2 safety/equipment;From elevated surface Stand pivot transfers: Min assist;+2 safety/equipment       [name] transfer comment: Pt requires max verbal and tactile cues to maintain WB status during stand pivot transfer.  Pt educated on technique for hopping on RLE to avoid WB on LLE and pt demonstrates this x1; however this causes pain  in his testicles so pt completes remainder of stand pivot by shuffling RLE.  Ambulation/Gait             [name] Gait Details: pivotal steps only   Information systems manager [place] (Stroke Patients Only)       Balance Overall balance assessment: Needs assistance Sitting-balance support: Feet supported;Bilateral upper extremity supported Sitting balance-Leahy Scale: Poor     Standing balance support: Bilateral upper extremity supported;During functional activity Standing balance-Leahy Scale: Poor                      Cognition Arousal/Alertness: Awake/alert Behavior During Therapy: Agitated Overall Cognitive Status: Within Functional Limits for tasks assessed                      Exercises Total Joint Exercises Ankle Circles/Pumps: AROM;Both;10 reps;Supine Quad Sets: AROM;Both;5 reps;Supine Heel Slides: AAROM;Left;5 reps;Supine Hip ABduction/ADduction: AAROM;Left;5 reps;Supine    [name] Comments        Pertinent Vitals/Pain Pain Assessment: 0-10 Pain Score: 8  Pain Location: testicles w/ stand pivot, R hip Pain Descriptors / Indicators: Aching;Constant;Sharp;Shooting Pain Intervention(s): Limited activity within patient's tolerance;Monitored during session;Repositioned;Ice applied    Home Living                      Prior Function            PT Goals (current goals can now be found in the care plan  section) Acute Rehab PT Goals Patient Stated Goal: to go to rehab PT Goal Formulation: With patient Time For Goal Achievement: 08/14/14 Potential to Achieve Goals: Good Progress towards PT goals: Progressing toward goals    Frequency  Min 5X/week    PT Plan Current plan remains appropriate    Co-evaluation             End of Session Equipment Utilized During Treatment: Gait belt Activity Tolerance: Patient limited by pain;Patient limited by fatigue Patient left: in chair;with call  bell/phone within reach     Time: 1127-1145 PT Time Calculation (min) (ACUTE ONLY): 18 min  Charges:  $Therapeutic Activity: 8-22 mins                    G Codes:      [name] PT, [place]PT [phone] Pager: [phone] 08/16/2014, 1:35 PM

## 2014-08-16 NOTE — Clinical Social Work Note (Signed)
Clinical Social Worker continuing to follow patient and family for support and discharge planning needs.  CSW spoke with patient at bedside who is hesitantly agreeable with SNF placement.  Patient clearly verbalizes his wishes to [facility], [facility], or [facility].  CSW [place] and follow up with available bed offers.  Patient states that if his insurance does not approve or there is no available bed at time of discharge he may decline SNF placement at that time and return home with [place] mother in [place].  CSW remains available for support and to facilitate patient discharge needs once medically stable.  Macario GoldsJesse Margeret Stachnik, Kentucky[place]CS[place] [phone]

## 2014-08-16 NOTE — Progress Notes (Signed)
Patient ID: [name], male   DOB: 1975/07/21, 39 y.o.   MRN: [id number] 3 Days Post-Op  Subjective: Slept OK, getting ready to work with therapies  Objective: Vital signs in last 24 hours: Temp:  [99 F (37.2 C)-99.5 F (37.5 C)] 99 F (37.2 C) (04/29 0446) Pulse Rate:  [92-96] 92 (04/29 0446) Resp:  [18-19] 18 (04/29 0446) BP: (100-116)/(58-68) 114/61 mmHg (04/29 0446) SpO2:  [91 %-97 %] 97 % (04/29 0446) Last BM Date: 08/11/14  Intake/Output from previous day: 04/28 0701 - 04/29 0700 In: 1620 [P.O.:960; IV Piggyback:660] Out: 2735 [Urine:2735] Intake/Output this shift:    General appearance: alert and cooperative Resp: clear to auscultation bilaterally Cardio: regular rate and rhythm GI: soft, NT, ND Male genitalia: scrotal edema  Lab Results: CBC   Recent Labs  08/15/14 0600 08/16/14 0402  WBC 8.7 7.5  HGB 7.9* 7.2*  HCT 22.6* 21.2*  PLT 209 210   BMET  Recent Labs  08/14/14 0553 08/15/14 0600  NA 130* 131*  K 4.3 3.5  CL 99 97  CO2 22 26  GLUCOSE 161* 120*  BUN 14 8  CREATININE 1.18 1.10  CALCIUM 7.8* 8.0*  7.9*   PT/INR No results for input(s): LABPROT, INR in the last 72 hours. ABG No results for input(s): PHART, HCO3 in the last 72 hours.  Invalid input(s): PCO2, PO2  Studies/Results: No results found.  Anti-infectives: Anti-infectives    Start     Dose/Rate Route Frequency Ordered Stop   08/15/14 0600  clindamycin (CLEOCIN) capsule 300 mg     300 mg Oral 3 times per day 08/13/14 1849 08/17/14 2159   08/13/14 1900  clindamycin (CLEOCIN) IVPB 600 mg     600 mg 100 mL/hr over 30 Minutes Intravenous Every 6 hours 08/13/14 1849 08/14/14 0936   08/13/14 0800  clindamycin (CLEOCIN) IVPB 600 mg     600 mg 100 mL/hr over 30 Minutes Intravenous On call to O.R. 08/12/14 1044 08/13/14 1315   08/12/14 1400  [MAR Hold]  clindamycin (CLEOCIN) capsule 300 mg  Status:  Discontinued     (MAR Hold since 08/13/14 1026)   300 mg Oral 3 times per  day 08/12/14 1041 08/13/14 1849   08/12/14 0600  clindamycin (CLEOCIN) IVPB 900 mg  Status:  Discontinued     900 mg 100 mL/hr over 30 Minutes Intravenous On call to O.R. 08/12/14 0144 08/13/14 0924      Assessment/Plan: Fall from horse Multiple pelvis fxs s/p ORIF -- TDWB LLE per Dr. [name] ABL anemia -- Hb remains in 7 range, PLTs also stable Scrotal edema -- expected with this injury/surgery, continue ice packs FEN -- Added tramadol 4/28 VTE -- SCD's, Lovenox  Dispo -- PT/OT, CIR is evaluating   LOS: 4 days    Violeta GelinasBurke Flois Mctague, MD, MPH, FACS Trauma: [phone] General Surgery: [phone]  08/16/2014

## 2014-08-16 NOTE — Progress Notes (Signed)
Orthopaedic Trauma Service Progress Note  Subjective  Feeling better Dec scrotal swelling  + flatus, No BM yet Belly feels better Considering CIR   Review of Systems  Constitutional: Negative for fever and chills.  Respiratory: Negative for shortness of breath.   Cardiovascular: Negative for chest pain and palpitations.  Gastrointestinal: Negative for nausea and vomiting.  Genitourinary:       +scrotal swelling and discomfort   Neurological: Negative for tingling, sensory change and headaches.     Objective   BP 114/61 mmHg  Pulse 92  Temp(Src) 99 F (37.2 C) (Oral)  Resp 18  Ht _0  (1.651 m)  Wt 90.7 kg (199 lb 15.3 oz)  BMI 33.27 kg/m2  SpO2 97%  Intake/Output      04/28 0701 - 04/29 0700 04/29 0701 - 04/30 0700   P.O. 960    Blood     IV Piggyback 660    Total Intake(mL/kg) 1620 (17.9)    Urine (mL/kg/hr) 2735 (1.3)    Total Output 2735     Net -1115            Labs  Results for MILAD, BUBLITZ (MRN [id number]) as of 08/16/2014 09:01  Ref. Range 08/15/2014 06:00  Sodium Latest Ref Range: 135-145 mmol/L 131 (L)  Potassium Latest Ref Range: 3.5-5.1 mmol/L 3.5  Chloride Latest Ref Range: 96-112 mmol/L 97  CO2 Latest Ref Range: 19-32 mmol/L 26  Mean Plasma Glucose Latest Units: mg/dL 117  BUN Latest Ref Range: 6-23 mg/dL 8  Creatinine Latest Ref Range: 0.50-1.35 mg/dL 1.10  Calcium Latest Ref Range: 8.4-10.5 mg/dL 8.0 (L)  EGFR (Non-African Amer.) Latest Ref Range: >90 mL/min 84 (L)  EGFR (African American) Latest Ref Range: >90 mL/min >90  Glucose Latest Ref Range: 70-99 mg/dL 120 (H)  Anion gap Latest Ref Range: 5-15  8  Calcium, Total (PTH) Latest Ref Range: 8.7-10.2 mg/dL 7.9 (L)  Phosphorus Latest Ref Range: 2.3-4.6 mg/dL 2.2 (L)  Magnesium Latest Ref Range: 1.5-2.5 mg/dL 1.9  Alkaline Phosphatase Latest Ref Range: 39-117 U/L 44  Albumin Latest Ref Range: 3.5-5.2 g/dL 2.6 (L)  AST Latest Ref Range: 0-37 U/L 72 (H)  ALT Latest Ref Range: 0-53 U/L  40  Total Protein Latest Ref Range: 6.0-8.3 g/dL 5.1 (L)  Total Bilirubin Latest Ref Range: 0.3-1.2 mg/dL 0.9  PREALBUMIN Latest Ref Range: 21-43 mg/dL 12 (L)  Transferrin Latest Ref Range: 200-370 mg/dL 187 (L)   Results for JERMANY, RIMEL (MRN [id number]) as of 08/16/2014 09:01  Ref. Range 08/16/2014 04:02  WBC Latest Ref Range: 4.0-10.5 K/uL 7.5  RBC Latest Ref Range: 4.22-5.81 MIL/uL 2.53 (L)  Hemoglobin Latest Ref Range: 13.0-17.0 g/dL 7.2 (L)  HCT Latest Ref Range: 39.0-52.0 % 21.2 (L)  MCV Latest Ref Range: 78.0-100.0 fL 83.8  MCH Latest Ref Range: 26.0-34.0 pg 28.5  MCHC Latest Ref Range: 30.0-36.0 g/dL 34.0  RDW Latest Ref Range: 11.5-15.5 % 14.2  Platelets Latest Ref Range: 150-400 K/uL 210  Results for CLANCE, BAQUERO (MRN [id number]) as of 08/16/2014 09:01  Ref. Range 08/13/2014 06:47  Vit D, 25-Hydroxy Latest Ref Range: 30.0-100.0 ng/mL 21.9 (L)    Exam  Gen: resting comfortably in bed, NAD Lungs: clear anterior fields   Cardiac: tachy but regular    Abd: + BS, mild distention, NT Pelvis: dressings stable Ext:        Left Lower Extremity               Ext warm               +  DP pulse             DPN, SPN, TN sensation intact               Dec LFCN sensation               + hip adduction             EHL, FHL, AT, PT, peroneals, gastroc motor intact             + Quad contraction             Ecchymosis stable                Assessment and Plan   POD/HD#: 43  39 year old white male s/p horse riding accident with a severely comminuted both column left acetabulum fracture   1. Horse riding accident   2. Both column left acetabulum fracture            s/p ORIF                          TDWB x 8 weeks with graduated WB thereafter             No formal hip precautions             PT/OT                          Ice prn             Elevate extremity for swelling control   Dressing changes as needed   CIR vs SNF   3. Dental abscess             completed  course of clindamycin  4. Positive tox screen             pt doesn't perceive any problems with his substance use  5. Pain management:             continue with current regimen   6. ABL anemia/Hemodynamics             mild dec today  Very slight improvement with product on 08/14/2014  No apparent symptoms   Monitor, now POD 3 and should have equilibrated                            7. DVT/PE prophylaxis:             SCDs for now             Lovenox x 21 days followed by ASA 325 mg po BID   8. ID:               Clindamycin 300 mg po q8h--> completed   9. Metabolic Bone Disease:           Vitamin D insufficiency               Will check additional labs---> pending              Supplement vitamin D   10. Activity:             TDWB L leg             PT/OT evals   11. FEN/Foley/Lines:             advance diet and bowel regimen   On reglan for  another 24 hours                12.  Dispo:           CIR admissions nurse checking with pts insurance company  SW to assist with secondary dc plan     Pt concerned about missing a court date, unsure when it is. Informed him to have his lawyer contact our office  Pt to have sister bring short term disability papers to our office as well     Jari Pigg, PA-C [facility] [phone] (O) 08/16/2014 9:00 AM

## 2014-08-16 NOTE — Progress Notes (Addendum)
I met with pt at bedside. I explained the intensity of rehab at CIR vs SNF. He wishes to pursue inpt rehab admission. I will begin [place] authorization. I explained that I would likely know by Monday their decision, but can not guarantee bed here. Recommend pursuing other inpt rehab facilities as well as SNF. He plans to stay with his Mom in [place] and his sister is a Marine scientist at [employer]. He is open to [facility] and [facility]. I will follow up on Monday. I have notified RN [place] and SW. [phone]

## 2014-08-17 LAB — CBC
HCT: 22.4 % — ABNORMAL LOW (ref 39.0–52.0)
HEMOGLOBIN: 7.7 g/dL — AB (ref 13.0–17.0)
MCH: 29.4 pg (ref 26.0–34.0)
MCHC: 34.4 g/dL (ref 30.0–36.0)
MCV: 85.5 fL (ref 78.0–100.0)
Platelets: 237 10*3/uL (ref 150–400)
RBC: 2.62 MIL/uL — ABNORMAL LOW (ref 4.22–5.81)
RDW: 14.7 % (ref 11.5–15.5)
WBC: 5.8 10*3/uL (ref 4.0–10.5)

## 2014-08-17 LAB — EXPECTORATED SPUTUM ASSESSMENT W REFEX TO RESP CULTURE

## 2014-08-17 LAB — VITAMIN D 1,25 DIHYDROXY
VITAMIN D3 1, 25 (OH): 27 pg/mL
Vitamin D 1, 25 (OH)2 Total: 30 pg/mL

## 2014-08-17 LAB — EXPECTORATED SPUTUM ASSESSMENT W GRAM STAIN, RFLX TO RESP C

## 2014-08-17 MED ORDER — HYDROMORPHONE HCL 1 MG/ML IJ SOLN
0.5000 mg | INTRAMUSCULAR | Status: DC | PRN
  Administered 2014-08-17 – 2014-08-19 (×10): 0.5 mg via INTRAVENOUS
  Filled 2014-08-17 (×10): qty 1

## 2014-08-17 MED ORDER — MORPHINE SULFATE ER 30 MG PO TBCR
30.0000 mg | EXTENDED_RELEASE_TABLET | Freq: Two times a day (BID) | ORAL | Status: DC
  Administered 2014-08-17 (×2): 30 mg via ORAL
  Filled 2014-08-17 (×2): qty 1

## 2014-08-17 MED ORDER — METHOCARBAMOL 500 MG PO TABS
1000.0000 mg | ORAL_TABLET | Freq: Four times a day (QID) | ORAL | Status: DC | PRN
  Administered 2014-08-17 – 2014-08-19 (×6): 1000 mg via ORAL
  Filled 2014-08-17 (×6): qty 2

## 2014-08-17 NOTE — Progress Notes (Signed)
Pt c/o back itching.  Had noted rash on upper back earlier with red small bumps.  Pt had stated" ok for now".  After checking on pt later, Mr Dierdre SearlesDoby requested something for it.  Md notified.  Will continue to monitor. Karena Addisonoro, Leilanni Halvorson T

## 2014-08-17 NOTE — Progress Notes (Signed)
Pt c/o of rash on the upper part of back. Pt states it is sore to the touch. Area looks red with small bumps. Not open or draining, not itching. Pt was repositioned in the bed with pillows to lift that area off the bed. Will continue to monitor.

## 2014-08-17 NOTE — Progress Notes (Signed)
Patient ID: Oralia RudSteven L Coppess, male   DOB: [birth year], 39 y.o.   MRN: [id number] i had patient on my list and d/ced his dilaudid then noticed Dr. [name] took over for Dr. August Saucerean.

## 2014-08-17 NOTE — Progress Notes (Signed)
Patient ID: [name], male   DOB: 1975/08/05, 39 y.o.   MRN: [id number]   LOS: 5 days   Subjective: On BSC, still having significant pain   Objective: Vital signs in last 24 hours: Temp:  [97 F (36.1 C)-99 F (37.2 C)] 97 F (36.1 C) (04/30 0554) Pulse Rate:  [96] 96 (04/30 0554) BP: (116-120)/(63-68) 120/68 mmHg (04/30 0554) SpO2:  [97 %-98 %] 98 % (04/30 0554) Last BM Date: 08/11/14   Laboratory  CBC  Recent Labs  08/16/14 0402 08/17/14 0422  WBC 7.5 5.8  HGB 7.2* 7.7*  HCT 21.2* 22.4*  PLT 210 237    Physical Exam General appearance: alert and no distress Resp: clear to auscultation bilaterally Cardio: regular rate and rhythm GI: normal findings: bowel sounds normal and soft, non-tender   Assessment/Plan: Fall from horse Multiple pelvis fxs s/p ORIF -- TDWB LLE per Dr. [name] ABL anemia -- Stable FEN -- Still requiring frequent IV Dilaudid, will d/c tramadol and add MS Contin VTE -- SCD's, Lovenox  Dispo -- PT/OT, awaiting insurance approval for CIR    Freeman CaldronMichael J. Lakeshia Dohner, PA-C Pager: [phone] General Trauma PA Pager: [phone]  08/17/2014

## 2014-08-18 LAB — CBC
HEMATOCRIT: 25 % — AB (ref 39.0–52.0)
HEMOGLOBIN: 8.2 g/dL — AB (ref 13.0–17.0)
MCH: 28.3 pg (ref 26.0–34.0)
MCHC: 32.8 g/dL (ref 30.0–36.0)
MCV: 86.2 fL (ref 78.0–100.0)
Platelets: 294 10*3/uL (ref 150–400)
RBC: 2.9 MIL/uL — AB (ref 4.22–5.81)
RDW: 14.8 % (ref 11.5–15.5)
WBC: 6.2 10*3/uL (ref 4.0–10.5)

## 2014-08-18 MED ORDER — MORPHINE SULFATE ER 30 MG PO TBCR
30.0000 mg | EXTENDED_RELEASE_TABLET | Freq: Three times a day (TID) | ORAL | Status: DC
  Administered 2014-08-18 – 2014-08-19 (×4): 30 mg via ORAL
  Filled 2014-08-18 (×4): qty 1

## 2014-08-18 NOTE — Progress Notes (Signed)
Patient ID: Oralia RudSteven L Silberstein, male   DOB: Mar 15, 1976, 39 y.o.   MRN: [id number]   LOS: 6 days   Subjective: C/o right shoulder pain, woke up with it. Thinks he might have strained it using trapeze.   Objective: Vital signs in last 24 hours: Temp:  [98.7 F (37.1 C)-99.4 F (37.4 C)] 99.4 F (37.4 C) (05/01 0535) Pulse Rate:  [90-107] 90 (05/01 0535) Resp:  [18] 18 (05/01 0535) BP: (109-116)/(61) 109/61 mmHg (05/01 0535) SpO2:  [95 %-100 %] 95 % (05/01 0535) Last BM Date: 08/17/14   Physical Exam General appearance: alert and no distress Resp: clear to auscultation bilaterally Cardio: regular rate and rhythm GI: normal findings: bowel sounds normal and soft, non-tender Extremities: Right shoulder: WNL except healed abrasion posteriorly   Assessment/Plan: Fall from horse Multiple pelvis fxs s/p ORIF -- TDWB LLE per Dr. [name] ABL anemia -- Stable FEN -- IV Dilaudid use improved, increase MS Contin slightly VTE -- SCD's, Lovenox  Dispo -- PT/OT, awaiting insurance approval for CIR    Freeman CaldronMichael J. Harriet Bollen, PA-C Pager: [phone] General Trauma PA Pager: [phone]  08/18/2014

## 2014-08-19 ENCOUNTER — Inpatient Hospital Stay (HOSPITAL_COMMUNITY)
Admission: RE | Admit: 2014-08-19 | Discharge: 2014-08-28 | DRG: 560 | Disposition: A | Discharge status: Home Health | Payer: BLUE CROSS/BLUE SHIELD | Source: Intra-hospital | Attending: Physical Medicine & Rehabilitation | Admitting: Physical Medicine & Rehabilitation

## 2014-08-19 ENCOUNTER — Inpatient Hospital Stay (HOSPITAL_COMMUNITY): Payer: BLUE CROSS/BLUE SHIELD

## 2014-08-19 DIAGNOSIS — E559 Vitamin D deficiency, unspecified: Secondary | ICD-10-CM | POA: Diagnosis present

## 2014-08-19 DIAGNOSIS — F1410 Cocaine abuse, uncomplicated: Secondary | ICD-10-CM | POA: Diagnosis present

## 2014-08-19 DIAGNOSIS — T814XXA Infection following a procedure, initial encounter: Secondary | ICD-10-CM | POA: Diagnosis not present

## 2014-08-19 DIAGNOSIS — D62 Acute posthemorrhagic anemia: Secondary | ICD-10-CM | POA: Diagnosis present

## 2014-08-19 DIAGNOSIS — G8918 Other acute postprocedural pain: Secondary | ICD-10-CM | POA: Diagnosis not present

## 2014-08-19 DIAGNOSIS — F1210 Cannabis abuse, uncomplicated: Secondary | ICD-10-CM | POA: Diagnosis present

## 2014-08-19 DIAGNOSIS — R451 Restlessness and agitation: Secondary | ICD-10-CM | POA: Diagnosis present

## 2014-08-19 DIAGNOSIS — N508 Other specified disorders of male genital organs: Secondary | ICD-10-CM | POA: Diagnosis present

## 2014-08-19 DIAGNOSIS — S32409S Unspecified fracture of unspecified acetabulum, sequela: Secondary | ICD-10-CM | POA: Diagnosis not present

## 2014-08-19 DIAGNOSIS — Z87891 Personal history of nicotine dependence: Secondary | ICD-10-CM | POA: Diagnosis not present

## 2014-08-19 DIAGNOSIS — N5089 Other specified disorders of the male genital organs: Secondary | ICD-10-CM | POA: Diagnosis present

## 2014-08-19 DIAGNOSIS — S32409A Unspecified fracture of unspecified acetabulum, initial encounter for closed fracture: Secondary | ICD-10-CM | POA: Diagnosis present

## 2014-08-19 DIAGNOSIS — M25562 Pain in left knee: Secondary | ICD-10-CM | POA: Diagnosis not present

## 2014-08-19 DIAGNOSIS — S3022XS Contusion of scrotum and testes, sequela: Secondary | ICD-10-CM | POA: Diagnosis not present

## 2014-08-19 DIAGNOSIS — K913 Postprocedural intestinal obstruction: Secondary | ICD-10-CM | POA: Diagnosis present

## 2014-08-19 DIAGNOSIS — E349 Endocrine disorder, unspecified: Secondary | ICD-10-CM | POA: Diagnosis present

## 2014-08-19 DIAGNOSIS — K5900 Constipation, unspecified: Secondary | ICD-10-CM | POA: Diagnosis present

## 2014-08-19 DIAGNOSIS — S32442D Displaced fracture of posterior column [ilioischial] of left acetabulum, subsequent encounter for fracture with routine healing: Secondary | ICD-10-CM

## 2014-08-19 DIAGNOSIS — K567 Ileus, unspecified: Secondary | ICD-10-CM | POA: Diagnosis present

## 2014-08-19 DIAGNOSIS — S32432D Displaced fracture of anterior column [iliopubic] of left acetabulum, subsequent encounter for fracture with routine healing: Principal | ICD-10-CM

## 2014-08-19 DIAGNOSIS — M25569 Pain in unspecified knee: Secondary | ICD-10-CM

## 2014-08-19 DIAGNOSIS — R21 Rash and other nonspecific skin eruption: Secondary | ICD-10-CM | POA: Diagnosis present

## 2014-08-19 DIAGNOSIS — S32402S Unspecified fracture of left acetabulum, sequela: Secondary | ICD-10-CM | POA: Diagnosis not present

## 2014-08-19 DIAGNOSIS — Z09 Encounter for follow-up examination after completed treatment for conditions other than malignant neoplasm: Secondary | ICD-10-CM

## 2014-08-19 LAB — CULTURE, RESPIRATORY

## 2014-08-19 LAB — CULTURE, RESPIRATORY W GRAM STAIN: Culture: NORMAL

## 2014-08-19 MED ORDER — ALUM & MAG HYDROXIDE-SIMETH 200-200-20 MG/5ML PO SUSP: 30.0000 mL | ORAL | Status: DC | PRN

## 2014-08-19 MED ORDER — VITAMIN D (ERGOCALCIFEROL) 1.25 MG (50000 UNIT) PO CAPS: 50000.0000 [IU] | ORAL_CAPSULE | ORAL | Status: DC

## 2014-08-19 MED ORDER — METHOCARBAMOL 500 MG PO TABS
1000.0000 mg | ORAL_TABLET | Freq: Four times a day (QID) | ORAL | Status: DC | PRN
  Administered 2014-08-19 – 2014-08-28 (×27): 1000 mg via ORAL
  Filled 2014-08-19 (×29): qty 2

## 2014-08-19 MED ORDER — VITAMIN D3 25 MCG (1000 UNIT) PO TABS
1000.0000 [IU] | ORAL_TABLET | Freq: Two times a day (BID) | ORAL | Status: DC
  Administered 2014-08-19 – 2014-08-27 (×17): 1000 [IU] via ORAL
  Filled 2014-08-19 (×20): qty 1

## 2014-08-19 MED ORDER — BISACODYL 10 MG RE SUPP: 10.0000 mg | Freq: Every day | RECTAL | Status: DC | PRN

## 2014-08-19 MED ORDER — VITAMIN C 500 MG PO TABS
500.0000 mg | ORAL_TABLET | Freq: Two times a day (BID) | ORAL | Status: DC
  Administered 2014-08-19 – 2014-08-28 (×18): 500 mg via ORAL
  Filled 2014-08-19 (×20): qty 1

## 2014-08-19 MED ORDER — ONDANSETRON HCL 4 MG PO TABS: 4.0000 mg | ORAL_TABLET | Freq: Four times a day (QID) | ORAL | Status: DC | PRN

## 2014-08-19 MED ORDER — ONDANSETRON HCL 4 MG/2ML IJ SOLN: 4.0000 mg | Freq: Four times a day (QID) | INTRAMUSCULAR | Status: DC | PRN

## 2014-08-19 MED ORDER — VITAMIN D (ERGOCALCIFEROL) 1.25 MG (50000 UNIT) PO CAPS
50000.0000 [IU] | ORAL_CAPSULE | ORAL | Status: DC
  Administered 2014-08-21: 50000 [IU] via ORAL
  Filled 2014-08-19 (×2): qty 1

## 2014-08-19 MED ORDER — DIPHENHYDRAMINE HCL 12.5 MG/5ML PO ELIX: 12.5000 mg | ORAL_SOLUTION | Freq: Four times a day (QID) | ORAL | Status: DC | PRN

## 2014-08-19 MED ORDER — ENOXAPARIN SODIUM 30 MG/0.3ML ~~LOC~~ SOLN
30.0000 mg | Freq: Two times a day (BID) | SUBCUTANEOUS | Status: DC
  Administered 2014-08-19 – 2014-08-26 (×14): 30 mg via SUBCUTANEOUS
  Filled 2014-08-19 (×21): qty 0.3

## 2014-08-19 MED ORDER — TRAMADOL HCL 50 MG PO TABS
50.0000 mg | ORAL_TABLET | Freq: Four times a day (QID) | ORAL | Status: DC | PRN
  Administered 2014-08-20 – 2014-08-28 (×9): 50 mg via ORAL
  Filled 2014-08-19 (×9): qty 1

## 2014-08-19 MED ORDER — TRIAMCINOLONE ACETONIDE 0.1 % EX CREA
TOPICAL_CREAM | Freq: Two times a day (BID) | CUTANEOUS | Status: DC
  Administered 2014-08-22: 08:00:00 via TOPICAL
  Filled 2014-08-19: qty 15

## 2014-08-19 MED ORDER — GUAIFENESIN-DM 100-10 MG/5ML PO SYRP: 5.0000 mL | ORAL_SOLUTION | Freq: Four times a day (QID) | ORAL | Status: DC | PRN

## 2014-08-19 MED ORDER — OXYCODONE HCL 5 MG PO TABS
20.0000 mg | ORAL_TABLET | ORAL | Status: DC | PRN
  Administered 2014-08-19 – 2014-08-21 (×7): 20 mg via ORAL
  Filled 2014-08-19 (×7): qty 4

## 2014-08-19 MED ORDER — POLYSACCHARIDE IRON COMPLEX 150 MG PO CAPS
150.0000 mg | ORAL_CAPSULE | Freq: Every day | ORAL | Status: DC
Start: 2014-08-19 — End: 2014-08-28
  Administered 2014-08-19 – 2014-08-27 (×9): 150 mg via ORAL
  Filled 2014-08-19 (×10): qty 1

## 2014-08-19 MED ORDER — CHOLECALCIFEROL 25 MCG (1000 UT) PO TABS: 1000.0000 [IU] | ORAL_TABLET | Freq: Two times a day (BID) | ORAL | Status: DC

## 2014-08-19 MED ORDER — MORPHINE SULFATE ER 15 MG PO TBCR
30.0000 mg | EXTENDED_RELEASE_TABLET | Freq: Three times a day (TID) | ORAL | Status: DC
  Administered 2014-08-19 – 2014-08-23 (×11): 30 mg via ORAL
  Filled 2014-08-19 (×11): qty 2

## 2014-08-19 MED ORDER — POLYETHYLENE GLYCOL 3350 17 G PO PACK
17.0000 g | PACK | Freq: Every day | ORAL | Status: DC
  Administered 2014-08-20 – 2014-08-21 (×2): 17 g via ORAL
  Filled 2014-08-19 (×3): qty 1

## 2014-08-19 MED ORDER — TRAZODONE HCL 50 MG PO TABS: 25.0000 mg | ORAL_TABLET | Freq: Every evening | ORAL | Status: DC | PRN

## 2014-08-19 MED ORDER — GABAPENTIN 300 MG PO CAPS
300.0000 mg | ORAL_CAPSULE | Freq: Two times a day (BID) | ORAL | Status: DC
  Administered 2014-08-19 – 2014-08-28 (×18): 300 mg via ORAL
  Filled 2014-08-19 (×20): qty 1

## 2014-08-19 MED ORDER — FLEET ENEMA 7-19 GM/118ML RE ENEM: 1.0000 | ENEMA | Freq: Once | RECTAL | Status: AC | PRN

## 2014-08-19 NOTE — Discharge Instructions (Signed)
Or[facility]opaedic Trauma Service Discharge Instructions   General Discharge Instructions  WEIGHT BEARING STATUS: Touchdown Weightbearing Left leg  RANGE OF MOTION/ACTIVITY: no formal ROM restrictions L hip   Diet: as you were eating previously.  Can use over the counter stool softeners and bowel preparations, such as Miralax, to help with bowel movements.  Narcotics can be constipating.  Be sure to drink plenty of fluids  STOP SMOKING OR USING NICOTINE PRODUCTS!!!!  As discussed nicotine severely impairs your body's ability to heal surgical and traumatic wounds but also impairs bone healing.  Wounds and bone heal by forming microscopic blood vessels (angiogenesis) and nicotine is a vasoconstrictor (essentially, shrinks blood vessels).  Therefore, if vasoconstriction occurs to these microscopic blood vessels they essentially disappear and are unable to deliver necessary nutrients to the healing tissue.  This is one modifiable factor that you can do to dramatically increase your chances of healing your injury.    (This means no smoking, no nicotine gum, patches, etc)  DO NOT USE NONSTEROIDAL ANTI-INFLAMMATORY DRUGS (NSAID'S)  Using products such as Advil (ibuprofen), Aleve (naproxen), Motrin (ibuprofen) for additional pain control during fracture healing can delay and/or prevent the healing response.  If you would like to take over the counter (OTC) medication, Tylenol (acetaminophen) is ok.  However, some narcotic medications that are given for pain control contain acetaminophen as well. Therefore, you should not exceed more than 4000 mg of tylenol in a day if you do not have liver disease.  Also note that there are may OTC medicines, such as cold medicines and allergy medicines that my contain tylenol as well.  If you have any questions about medications and/or interactions please ask your doctor/PA or your pharmacist.   PAIN MEDICATION USE AND EXPECTATIONS  You have likely been given narcotic  medications to help control your pain.  After a traumatic event that results in an fracture (broken bone) with or without surgery, it is ok to use narcotic pain medications to help control one's pain.  We understand that everyone responds to pain differently and each individual patient will be evaluated on a regular basis for the continued need for narcotic medications. Ideally, narcotic medication use should last no more than 6-8 weeks (coinciding with fracture healing).   As a patient it is your responsibility as well to monitor narcotic medication use and report the amount and frequency you use these medications when you come to your office visit.   We would also advise that if you are using narcotic medications, you should take a dose prior to therapy to maximize you participation.  IF YOU ARE ON NARCOTIC MEDICATIONS IT IS NOT PERMISSIBLE TO OPERATE A MOTOR VEHICLE (MOTORCYCLE/CAR/TRUCK/MOPED) OR HEAVY MACHINERY DO NOT MIX NARCOTICS WITH OTHER CNS (CENTRAL NERVOUS SYSTEM) DEPRESSANTS SUCH AS ALCOHOL       ICE AND ELEVATE INJURED/OPERATIVE EXTREMITY  Using ice and elevating the injured extremity above your heart can help with swelling and pain control.  Icing in a pulsatile fashion, such as 20 minutes on and 20 minutes off, can be followed.    Do not place ice directly on skin. Make sure there is a barrier between to skin and the ice pack.    Using frozen items such as frozen peas works well as the conform nicely to the are that needs to be iced.  USE AN ACE WRAP OR TED HOSE FOR SWELLING CONTROL  In addition to icing and elevation, Ace wraps or TED hose are used to help limit  and resolve swelling.  It is recommended to use Ace wraps or TED hose until you are informed to stop.    When using Ace Wraps start the wrapping distally (farthest away from the body) and wrap proximally (closer to the body)   Example: If you had surgery on your leg or thing and you do not have a splint on, start the ace  wrap at the toes and work your way up to the thigh        If you had surgery on your upper extremity and do not have a splint on, start the ace wrap at your fingers and work your way up to the upper arm  IF YOU ARE IN A SPLINT OR CAST DO NOT [place]   If your splint gets wet for any reason please contact the office immediately. You may shower in your splint or cast as long as you keep it dry.  This can be done by wrapping in a cast cover or garbage back (or similar)  Do Not stick any thing down your splint or cast such as pencils, money, or hangers to try and scratch yourself with.  If you feel itchy take benadryl as prescribed on the bottle for itching  IF YOU ARE IN A CAM BOOT (BLACK BOOT)  You may remove boot periodically. Perform daily dressing changes as noted below.  Wash the liner of the boot regularly and wear a sock when wearing the boot. It is recommended that you sleep in the boot until told otherwise  CALL THE OFFICE WITH ANY QUESTIONS OR CONCERTS: [phone]     Discharge Pin Site Instructions  Dress pins daily with Kerlix roll starting on POD 2. Wrap the Kerlix so that it tamps the skin down around the pin-skin interface to prevent/limit motion of the skin relative to the pin.  (Pin-skin motion is the primary cause of pain and infection related to external fixator pin sites).  Remove any crust or coagulum that may obstruct drainage with a saline moistened gauze or soap and water.  After POD 3, if there is no discernable drainage on the pin site dressing, the interval for change can by increased to every other day.  You may shower with the fixator, cleaning all pin sites gently with soap and water.  If you have a surgical wound this needs to be completely dry and without drainage before showering.  The extremity can be lifted by the fixator to facilitate wound care and transfers.  Notify the office/Doctor if you experience increasing drainage, redness, or  pain from a pin site, or if you notice purulent (thick, snot-like) drainage.  Discharge Wound Care Instructions  Do NOT apply any ointments, solutions or lotions to pin sites or surgical wounds.  These prevent needed drainage and even though solutions like hydrogen peroxide kill bacteria, they also damage cells lining the pin sites that help fight infection.  Applying lotions or ointments can keep the wounds moist and can cause them to breakdown and open up as well. This can increase the risk for infection. When in doubt call the office.  Surgical incisions should be dressed daily.  If any drainage is noted, use one layer of adaptic, then gauze, Kerlix, and an ace wrap.  Once the incision is completely dry and without drainage, it may be left open to air out.  Showering may begin 36-48 hours later.  Cleaning gently with soap and water.  Traumatic wounds should be dressed daily  as well.    One layer of adaptic, gauze, Kerlix, then ace wrap.  The adaptic can be discontinued once the draining has ceased    If you have a wet to dry dressing: wet the gauze with saline the squeeze as much saline out so the gauze is moist (not soaking wet), place moistened gauze over wound, then place a dry gauze over the moist one, followed by Kerlix wrap, then ace wrap.

## 2014-08-19 NOTE — Progress Notes (Signed)
I have insurance approval and bed available to admit pt to inpt rehab today. I have notified RN CM, SW, and Trauma PA. I will make arrangements to admit for today. [phone]

## 2014-08-19 NOTE — Progress Notes (Signed)
[place] [facility] [place]; RN requested not to pull IV saline lock access just in case IV fluids or Rx's needed between now and morning. All belongings gathered. Pain Rx administered @ 1658 for comfort with transfer. Per patient request, left in his bedside chair with legs elevated - taken by this RN to [facility] [place].

## 2014-08-19 NOTE — Clinical Social Work Note (Signed)
Clinical Social Worker continuing to follow patient and family for support and discharge planning needs.  Per inpatient rehab admissions coordinator, patient has insurance approval and bed available for inpatient rehab - plans to discharge today.  Inpatient rehab admissions coordinator to communicate plans with patient at bedside.  Clinical Social Worker will sign off for now as social work intervention is no longer needed. Please consult us again if new need arises.  Macario GoldsJesse Elonna Mcfarlane, Kentucky[place]CS[place] [phone]

## 2014-08-19 NOTE — Progress Notes (Signed)
Orth[facility]edic [facility] Progress Note  Subjective  Doing ok Strained R shoulder over weekend, likely using trapeze Has showered Voiding well + Flatus   Review of Systems  Constitutional: Negative for fever and chills.  Respiratory: Negative for shortness of breath and wheezing.   Cardiovascular: Negative for chest pain and palpitations.  Gastrointestinal: Negative for nausea and vomiting.     Objective   BP 108/64 mmHg  Pulse 102  Temp(Src) 99.3 F (37.4 C) (Oral)  Resp 18  Ht 5\' 5"  (1.651 m)  Wt 90.7 kg (199 lb 15.3 oz)  BMI 33.27 kg/m2  SpO2 99%  Intake/Output      05/01 0701 - 05/02 0700 05/02 0701 - 05/03 0700   P.O. 120    Total Intake(mL/kg) 120 (1.3)    Urine (mL/kg/hr) 2700 (1.2)    Total Output 2700     Net -2580            Labs  Results for [name] (MRN [id number]) as of 08/19/2014 08:37  Ref. Range 08/15/2014 06:00  Testosterone Latest Ref Range: [phone] ng/dL 81 ([name])    Exam  Gen: awake and alert, NAD Lungs: clear anterior fields Cardiac: RRR, s1 and s2 Abd: + BS, distended, NT Pelvis: incisions look pristine  Dec swelling of testicles  Ecchymosis stable  Ext:  Left Lower Extremity               Ext warm               + DP pulse             DPN, SPN, TN sensation intact               Dec LFCN sensation               + hip adduction             EHL, FHL, AT, PT, peroneals, gastroc motor intact             + Quad contraction             Ecchymosis stable  Right upper extremity   No acute findings  Full passive and active ROM  Mild pain with resisted muscle testing   Vague tenderness along posterior delt  Motor and sensory functions intact  Ext warm  + Radial pulse  No impingement signs     Assessment and Plan   POD/HD#: 846   39 year old white male s/p horse riding accident with a severely comminuted both column left acetabulum fracture   1. Horse riding accident   2. Both column left acetabulum fracture   s/p ORIF                          TDWB x 8 weeks with graduated WB thereafter             No formal hip precautions             PT/OT                          Ice prn             Elevate extremity for swelling control               Dressing changed  Dressing changes prn  Ok to shower  CIR vs SNF        Right Shoulder Sprain  Symptomatic care  Monitor   3. Dental abscess             completed course of clindamycin  4. Positive tox screen             pt doesn't perceive any problems with his substance use  5. Pain management:             continue with current regimen per TS   6. ABL anemia/Hemodynamics           stable                           7. DVT/PE prophylaxis:             SCDs for now             Lovenox x 21 days followed by ASA 325 mg po BID   8. ID:               Clindamycin 300 mg po q8h--> completed   9. 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 Activity:             TDWB [name] leg             PT/OT evals   11. FEN/Foley/Lines:             diet as tolerated               12.  Dispo:      Stable for dc today from ortho standpoint   Follow up with ortho in 10 days     Kevin Latin, PA-C [facility] [phone] (O) 08/19/2014 8:36 AM

## 2014-08-19 NOTE — H&P (Signed)
Physical Medicine and Rehabilitation Admission H&P   Chief Complaint  Patient presents with  . Left acetabular fracture.     HPI: [name] is a 39 y.o. male who was admitted on 08/11/14 after being thrown off his horse which subsequently landed on top of him. UDS positive for cocaine and THC. Patient was found to have severely comminuted both column acetabulum fracture and was placed in traction by Dr. August Saucer. He was evaluated by Dr Carola Frost and underwent ORIF left both column acetabular fracture with removal of tibial pin. Post op TDWB LLE X 12 weeks with recommendations for lovenox X 20 days followed by ASA bid. ABLA treated with 2 units PRBC. Patient has had issues with scrotal pain as well as abdominal discomfort due to ileus and was started on bowel program without results? He had had recent tooth abscess treated with clindamycin. Low Vitamin D levels likely due to cannabis use and supplementation recommended by ortho. He has been requiring IV dilaudid for pain management and was started on MS contin for more consistent pain relief. Therapy ongoing and patient showing good tolerance. CIR recommended by MD and rehab team and patient admitted today.    Review of Systems  HENT: Negative for hearing loss.  Eyes: Negative for blurred vision and double vision.  Respiratory: Negative for cough.  Cardiovascular: Negative for chest pain and palpitations.  Gastrointestinal: Negative for heartburn, nausea, abdominal pain and constipation.  Genitourinary: Negative for dysuria, urgency and frequency.  Musculoskeletal: Positive for myalgias and joint pain.  Neurological: Negative for dizziness and headaches.  Psychiatric/Behavioral: The patient does not have insomnia.      Past Medical History  Diagnosis Date  . History of chicken pox   . History of kidney stones     Past Surgical History  Procedure Laterality Date  . Tonsillectomy    . Orif  acetabular fracture Left 08/13/2014    Procedure: LEFT OPEN REDUCTION INTERNAL FIXATION (ORIF) ACETABULAR FRACTURE; Surgeon: Myrene Galas, MD; Location: [facility]; Service: Orthopedics; Laterality: Left;    Family History  Problem Relation Age of Onset  . Cancer Mother     ? cancer. Lymph nodes removed from breast, uterine tumor?  . Cancer Maternal Grandfather     prostate. Dx in his late 23s  . Stroke Paternal Grandmother   . Heart disease Paternal Grandmother     cardiac stent ? valve replacement  . Cancer Paternal Grandfather     lung  . Pneumonia Paternal Grandfather     Social History: Lives alone but plans on d/c to mother's home. Mother works days. He works in [employer]. He reports that he quit smoking about 10 years ago. He does not have any smokeless tobacco history on file. He reports that he drinks alcohol on the weekends. . H/o of polysubstance abuse but reports that he quit use of marijuana, pain pills, and cocaine in 2009.    Allergies  Allergen Reactions  . Penicillins Hives    REACTION: childhood reaction    Medications Prior to Admission  Medication Sig Dispense Refill  . benzocaine (ORAJEL) 10 % mucosal gel Use as directed 1 application in the mouth or throat daily as needed for mouth pain.      Home: Home Living Family/patient expects to be discharged to:: Private residence (to mother's house at d/c) Living Arrangements: Alone Available Help at Discharge: Available PRN/intermittently (Mom works 8-5 and can come home at lunch daily. Sisters will) Type of Home: Apartment Home Access:  Other (comment) Entrance Stairs-Number of Steps: n/a Entrance Stairs-Rails: None Home Layout: Other (Comment) Home Equipment: None (Pt has ordered RW and manual WC to be at home upon d/c) Additional Comments: pt lived in an apartment pta. plans to stay with Mom at d/c. He is letting apartment go Lives  With: Alone  Functional History: Prior Function Level of Independence: Independent  Functional Status:  Mobility: Bed Mobility Overal bed mobility: Needs Assistance Bed Mobility: Supine to Sit Rolling: Min assist Sidelying to sit: Min assist, HOB elevated Supine to sit: Min assist, HOB elevated Sit to supine: Min assist, HOB elevated General bed mobility comments: min A for managing LLE and RLE to get to sitting EOB, heavy use of bed rails, HOB elevated. pt. states R UE strained due to heavy use of trapeze bar. reviewed benefits of not relying on it, even with him recognizing it has hurt his arm he continued to insist on trying to use it Transfers Overall transfer level: Needs assistance Equipment used: Rolling walker (2 wheeled) Transfers: Sit to/from Stand, Stand Pivot Transfers Sit to Stand: Min assist, +2 safety/equipment, From elevated surface Stand pivot transfers: Min assist, +2 safety/equipment General transfer comment: declined transfer as he wanted to wait for his pain meds and also states recliner is not comfortable Ambulation/Gait General Gait Details: pivotal steps only    ADL: ADL Overall ADL's : Needs assistance/impaired Eating/Feeding: Set up, Bed level Grooming: Set up, Bed level Upper Body Bathing: Minimal assitance, Sitting Lower Body Bathing: Total assistance, Sitting/lateral leans, Cueing for safety Upper Body Dressing : Minimal assistance, Sitting Lower Body Dressing: Total assistance, Sitting/lateral leans, Cueing for safety Toilet Transfer: Set up Toilet Transfer Details (indicate cue type and reason): pt. requests to use urinal eob as he reports he is still limited by swelling of his scrotum Toileting- Clothing Manipulation and Hygiene: Set up, Sitting/lateral lean General ADL Comments: agreeable to bed mobility and eob, states he sits eob and it is more comfortable than recliner due to scrotum inflammation   Cognition: Cognition Overall  Cognitive Status: Within Functional Limits for tasks assessed Orientation Level: Oriented X4 Cognition Arousal/Alertness: Awake/alert Behavior During Therapy: Agitated Overall Cognitive Status: Within Functional Limits for tasks assessed   Blood pressure 126/79, pulse 104, temperature 99.9 F (37.7 C), temperature source Oral, resp. rate 18, height 5\' 5"  (1.651 m), weight 90.7 kg (199 lb 15.3 oz), SpO2 100 %. Physical Exam  Nursing note and vitals reviewed. Constitutional: He is oriented to person, place, and time. He appears well-developed and well-nourished.  HENT:  Head: Normocephalic and atraumatic.  Eyes: Conjunctivae are normal. Pupils are equal, round, and reactive to light.  Neck: Normal range of motion. Neck supple.  Cardiovascular: Normal rate and regular rhythm.  Respiratory: Effort normal and breath sounds normal. No respiratory distress.  GI: He exhibits distension (but improved from couple of days ago. ). There is no tenderness.  Genitourinary:  Moderate scrotal edema with bruising.   Musculoskeletal: He exhibits edema.  Moderate edema with tenderness and resolving ecchymosis left upper thigh and into bilateral flanks/low back left more than right. Lower abdominal dressing with sutures and dry dressing.  Neurological: He is alert and oriented to person, place, and time. UE 5/5 with some limitiations due to pain in proximal muscles. RLE: 3/5hf, 3+ke, 4/5 ankle. LLE 1+ hf, 2k3, 3+ ankle. No sensory deficits. Cognitively seems appropriate. A little impulsive Skin: Skin is warm and dry.  Upper shoulders with macular papular rash. Left lower lumbar to flank area  with diffuse purple bruising.     Lab Results Last 48 Hours    Results for orders placed or performed during the hospital encounter of 08/11/14 (from the past 48 hour(s))  CBC Status: Abnormal   Collection Time: 08/18/14 2:35 PM  Result Value Ref Range   WBC 6.2 4.0 - 10.5 K/uL   RBC 2.90  (L) 4.22 - 5.81 MIL/uL   Hemoglobin 8.2 (L) 13.0 - 17.0 g/dL   HCT 16.1 (L) 09.6 - 04.5 %   MCV 86.2 78.0 - 100.0 fL   MCH 28.3 26.0 - 34.0 pg   MCHC 32.8 30.0 - 36.0 g/dL   RDW 40.9 81.1 - 91.4 %   Platelets 294 150 - 400 K/uL      Imaging Results (Last 48 hours)    No results found.       Medical Problem List and Plan: 1. Functional deficits secondary to Left acetabular fracture--TTWB X 8 weeks.  2. DVT Prophylaxis/Anticoagulation: Pharmaceutical: Lovenox D# 5/ 21 followed by ASA 325 mg bid.  3. Pain Management: Discontinue IV dilaudid as MS contin increased to tid today. Continue oxycodone 20 mg prn.  4. Mood: LCSW to follow for evaluation and support.  5. Neuropsych: This patient is capable of making decisions on his own behalf. 6. Skin/Wound Care: routine pressure relief measures.  7. Fluids/Electrolytes/Nutrition: Monitor I/O. Offer supplements as needed. Check lytes in am. 8. ABLA: Will continue to monitor. Recheck CBC in am.  9. Scrotal trauma/edema: Local treatment with elevation and ice--limited to 20 minutes as a time. .  10. Polysubstance abuse: Patient doesn't perceive any problems with his substance use 11. RUE strain: Due to continued trapeze bar use. Will d/c and educate on alternative methods.  12. Rash upper back: Likely heat rash. Continue microguard powder for antifungal coverage and will add steroid cream to help with itching.     Post Admission Physician Evaluation: 1. Functional deficits secondary to left acetabular fx. 2. Patient is admitted to receive collaborative, interdisciplinary care between the physiatrist, rehab nursing staff, and therapy team. 3. Patient's level of medical complexity and substantial therapy needs in context of that medical necessity cannot be provided at a lesser intensity of care such as a SNF. 4. Patient has experienced substantial functional loss from his/her baseline which was  documented above under the "Functional History" and "Functional Status" headings. Judging by the patient's diagnosis, physical exam, and functional history, the patient has potential for functional progress which will result in measurable gains while on inpatient rehab. These gains will be of substantial and practical use upon discharge in facilitating mobility and self-care at the household level. 5. Physiatrist will provide 24 hour management of medical needs as well as oversight of the therapy plan/treatment and provide guidance as appropriate regarding the interaction of the two. 6. 24 hour rehab nursing will assist with bladder management, bowel management, safety, skin/wound care, disease management, medication administration, pain management and patient education and help integrate therapy concepts, techniques,education, etc. 7. PT will assess and treat for/with: Lower extremity strength, range of motion, stamina, balance, functional mobility, safety, adaptive techniques and equipment, pain mgt, ortho precautions, edema control. Goals are: mod I. 8. OT will assess and treat for/with: ADL's, functional mobility, safety, upper extremity strength, adaptive techniques and equipment, pain mgt, ortho precautions, ego support. Goals are: mod I. Therapy may not yet proceed with showering this patient. 9. SLP will assess and treat for/with: n/a. Goals are: n/a. 10. Case Management and Social Worker will  assess and treat for psychological issues and discharge planning. 11. Team conference will be held weekly to assess progress toward goals and to determine barriers to discharge. 12. Patient will receive at least 3 hours of therapy per day at least 5 days per week. 13. ELOS: 7-8 days  14. Prognosis: excellent     Ranelle OysterZachary T. Zaryiah Barz, MD, [place] [facility] Center[facility]APMR [place] [facility] 08/19/2014

## 2014-08-19 NOTE — Progress Notes (Signed)
PMR Admission Coordinator Pre-Admission Assessment  Patient: [name] is an 39 y.o., male MRN: [id number] DOB: October 19, 1975 Height:  (165.1 cm) Weight: 90.7 kg (199 lb 15.3 oz)  Insurance Information HMO: PPO: PCP: IPA: 80/20: OTHER: Blue Options PRIMARY: [employer] [place] Policy#: [id number] Subscriber: pt CM Name: Amy Phone#: [phone] Fax#: [phone] Pre-Cert#: [id number] Employer: approved until 5/10. Updates due 5/9 Benefits: Phone #: [phone] Name: 4/29 Eff. Date: 11/17/13 Deduct: $1500 Out of Pocket Max: $3500 Life Max: none CIR: $250 copay per admi then covers 80% SNF: 80% 60 days Outpatient: $20 copay per visit Co-Pay: 30 visits combined Home Health: 80% Co-Pay: no visit limit DME: 80% Co-Pay: 20% Providers: in network  SECONDARY: none  Medicaid Application Date: Case Manager:  Disability Application Date: Case Worker:  Pt's sister has provided Dr. Magdalene Patricia office his short term disability paperwork for completion on 08/16/14  Emergency Contact Information Contact Information    Name Relation Home Work Mobile   [place] Sister [phone]     Gareth Morgan Mother   [phone]   No name specified         Current Medical History  Patient Admitting Diagnosis: left acetabular fracture s/p ORIF  History of Present Illness: [name] is a 40 y.o. male who was admitted on 08/11/14 after being thrown off his horse which subsequently landed on top of him. UDS positive for cocaine and THC. Patient was found to have severely comminuted both column acetabulum fracture and was placed in traction by Dr. August Saucer. He was evaluated by Dr Carola Frost and underwent ORIF left both column  acetabular fracture with removal of tibial pin. Post op TDWB LLE X 8 weeks with graduated WB thereafter. weeks with recommendations for lovenox X 20 days followed by ASA. ABLA treated with 2 units PRBC. Patient has had issues with scrotal pain as well as abdominal discomfort due to ileus and diet modified to liquids. Now on regular diet with pt having BM 08/17/14.  Patient states that he has difficulty sitting in a chair because it causes excess pressure on his scrotum. His main complaint in terms of pain is his scrotum. Using ice packs to assist with swelling.  5/2 pt complains of right shoulder sprain related to use of trapeze bar.  Past Medical History  Past Medical History  Diagnosis Date  . History of chicken pox   . History of kidney stones     Family History  family history includes Cancer in his maternal grandfather, mother, and paternal grandfather; Heart disease in his paternal grandmother; Pneumonia in his paternal grandfather; Stroke in his paternal grandmother.  Prior Rehab/Hospitalizations: none  Current Medications   Current facility-administered medications:  . acetaminophen (TYLENOL) tablet 650 mg, 650 mg, Oral, Q6H PRN **OR** acetaminophen (TYLENOL) suppository 650 mg, 650 mg, Rectal, Q6H PRN, Montez Morita, PA-C . alum & mag hydroxide-simeth (MAALOX/MYLANTA) 200-200-20 MG/5ML suspension 30 mL, 30 mL, Oral, PRN, Montez Morita, PA-C, 30 mL at 08/18/14 1249 . cholecalciferol (VITAMIN D) tablet 1,000 Units, 1,000 Units, Oral, BID, Montez Morita, PA-C, 1,000 Units at 08/19/14 [phone] . docusate sodium (COLACE) capsule 100 mg, 100 mg, Oral, BID, Freeman Caldron, PA-C, 100 mg at 08/19/14 0160 . enoxaparin (LOVENOX) injection 30 mg, 30 mg, Subcutaneous, Q12H, Freeman Caldron, PA-C, 30 mg at 08/19/14 1093 . gabapentin (NEURONTIN) capsule 300 mg, 300 mg, Oral, BID, Montez Morita, PA-C, 300 mg at 08/19/14 0926 . HYDROmorphone (DILAUDID) injection 0.5 mg, 0.5 mg,  Intravenous, Q2H PRN, Veverly Fells  Ophelia CharterYates, MD, 0.5 mg at 08/19/14 [id number] . methocarbamol (ROBAXIN) tablet 1,000 mg, 1,000 mg, Oral, Q6H PRN, Freeman CaldronMichael J Jeffery, PA-C, 1,000 mg at 08/19/14 1153 . metoCLOPramide (REGLAN) tablet 5-10 mg, 5-10 mg, Oral, Q8H PRN, 10 mg at 08/16/14 2048 **OR** metoCLOPramide (REGLAN) injection 5-10 mg, 5-10 mg, Intravenous, Q8H PRN, Montez MoritaKeith Paul, PA-C . morphine (MS CONTIN) 12 hr tablet 30 mg, 30 mg, Oral, 3 times per day, Freeman CaldronMichael J Jeffery, PA-C, 30 mg at 08/19/14 1153 . ondansetron (ZOFRAN) tablet 4 mg, 4 mg, Oral, Q6H PRN, 4 mg at 08/14/14 0621 **OR** ondansetron (ZOFRAN) injection 4 mg, 4 mg, Intravenous, Q6H PRN, Montez MoritaKeith Paul, PA-C . oxyCODONE (Oxy IR/ROXICODONE) immediate release tablet 10-20 mg, 10-20 mg, Oral, Q4H PRN, Freeman CaldronMichael J Jeffery, PA-C, 20 mg at 08/19/14 [id number] . polyethylene glycol (MIRALAX / GLYCOLAX) packet 17 g, 17 g, Oral, Daily, Freeman CaldronMichael J Jeffery, PA-C, 17 g at 08/19/14 [id number] . vitamin C (ASCORBIC ACID) tablet 500 mg, 500 mg, Oral, BID, Montez MoritaKeith Paul, PA-C, 500 mg at 08/19/14 [id number] . 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 Rides horses every Sunday. Not first Horse related accident. Admits to drinking and riding. Does not see issues with substance abuse with positive tox screen.  Home Assistive Devices / Equipment Home Assistive Devices/Equipment: None Home Equipment: None (Pt has ordered RW and manual WC to be at home upon d/c)  Pt states a friend has DME equipment that he can borrow.   Prior Functional  Level Prior Function Level of Independence: Independent  Current Functional Level Cognition  Overall Cognitive Status: Within Functional Limits for tasks assessed Orientation Level: Oriented X4   Extremity Assessment (includes Sensation/Coordination)  Upper Extremity Assessment: Defer to OT evaluation  Lower Extremity Assessment: LLE deficits/detail LLE Deficits / Details: weakness and limited ROM as expected s/p L hip ORIF LLE: Unable to fully assess due to pain    ADLs  Overall ADL's : Needs assistance/impaired Eating/Feeding: Set up, Bed level Grooming: Set up, Bed level Upper Body Bathing: Minimal assitance, Sitting Lower Body Bathing: Total assistance, Sitting/lateral leans, Cueing for safety Upper Body Dressing : Minimal assistance, Sitting Lower Body Dressing: Total assistance, Sitting/lateral leans, Cueing for safety Toilet Transfer: Set up Toilet Transfer Details (indicate cue type and reason): pt. requests to use urinal eob as he reports he is still limited by swelling of his scrotum Toileting- Clothing Manipulation and Hygiene: Set up, Sitting/lateral lean General ADL Comments: agreeable to bed mobility and eob, states he sits eob and it is more comfortable than recliner due to scrotum inflammation     Mobility  Overal bed mobility: Needs Assistance Bed Mobility: Supine to Sit Rolling: Min assist Sidelying to sit: Min assist, HOB elevated Supine to sit: Min assist, HOB elevated Sit to supine: Min assist, HOB elevated General bed mobility comments: min A for managing LLE and RLE to get to sitting EOB, heavy use of bed rails, HOB elevated. pt. states R UE strained due to heavy use of trapeze bar. reviewed benefits of not relying on it, even with him recognizing it has hurt his arm he continued to insist on trying to use it  Transfers  Overall transfer level: Needs assistance Equipment used: Rolling walker (2 wheeled) Transfers: Sit to/from Stand,  Stand Pivot Transfers Sit to Stand: Min assist, +2 safety/equipment, From elevated surface Stand pivot transfers: Min assist, +2 safety/equipment General transfer comment: declined transfer as he wanted to wait for his pain meds and also states recliner is not comfortable    Ambulation / Gait / Stairs / Wheelchair Mobility  Ambulation/Gait General Gait Details: pivotal steps only    Posture / Balance Balance Overall balance assessment: Needs assistance Sitting-balance support: Feet supported, Bilateral upper extremity supported Sitting balance-Leahy Scale: Poor Standing balance support: Bilateral upper extremity supported, During functional activity Standing balance-Leahy Scale: Poor    Special needs/care consideration Skin on 4/30 noted rash on upper back.  Bowel mgmt: 4/28 pt on clears diet due to abdominal distention and per Dr. Carola Frost slight ileus. Now on regular diet, passing flatus last BM 4/30. Pt reports two BMs since admission. Bladder mgmt: continent  Pt requesting SW write him a note to give to his lawyer. He has an upcoming court date and he questions whether he will be able to appear. Ortho MD has previously told him he should be able to appear in court . Court date not set yet.    Previous Home Environment Living Arrangements: Alone Lives With: Alone Available Help at Discharge: Available PRN/intermittently (Mom works 8-5 and can come home at lunch daily. Sisters will) Type of Home: Apartment Home Layout: Other (Comment) Home Access: Other (comment) Entrance Stairs-Rails: None Entrance Stairs-Number of Steps: n/a Bathroom Shower/Tub: Tub/shower unit, Engineer, building services: Standard Home Care Services: No Additional Comments: pt lived in an apartment pta. plans to stay with Mom at d/c. He is letting apartment go  Discharge Living Setting Plans for Discharge Living Setting: Other (Comment) (Plans to go stay with his Mom at d/c) Type of Home at  Discharge: House Discharge Home Layout: One level Discharge Home Access: Stairs to enter Entrance Stairs-Rails: Right, Left, Can reach both Entrance Stairs-Number of Steps: 4 Discharge Bathroom Shower/Tub: Tub/shower unit, Curtain Discharge Bathroom Toilet: Standard Discharge Bathroom Accessibility: Yes How Accessible: Accessible via walker Does the patient have any problems obtaining your medications?: No  Social/Family/Support Systems Patient Roles: Other (Comment) 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 Comes home at lunch, sisters assist when not working Caregiver Availability: Intermittent Discharge Plan Discussed with Primary Caregiver: Yes Is Caregiver In Agreement with Plan?: Yes Does Caregiver/Family have Issues with Lodging/Transportation while Pt is in Rehab?: No  Goals/Additional Needs Patient/Family Goal for Rehab: Mod I to supervision with PT, Mod I to supervision with OT Expected length of stay: ELOS 12- 16 days Pt/Family Agrees to Admission and willing to participate: Yes Program Orientation Provided & Reviewed with Pt/Caregiver Including Roles & Responsibilities: Yes   Decrease burden of Care through IP rehab admission: n/a  Possible need for SNF placement upon discharge:not anticipated  Patient Condition: This patient's medical and functional status has changed since the consult dated: 08/15/2014 in which the Rehabilitation Physician determined and documented that the patient's condition is appropriate for intensive rehabilitative care in an inpatient rehabilitation facility. See "History of Present Illness" (above) for medical update. Functional changes are: mod assist pivotal steps for gait only. Patient's medical and functional status update has been discussed with the  Rehabilitation physician and patient remains appropriate for inpatient rehabilitation. Will admit to  inpatient rehab today.  Preadmission Screen Completed By: Clois Dupes, 08/19/2014 12:10 PM ______________________________________________________________________  Discussed status with Dr. Riley Kill on 08/19/2014 at 1210 and received telephone approval for admission today.  Admission Coordinator: Clois Dupes, time 1210 Date 08/19/2014.          Cosigned by: Ranelle Oyster, MD at 08/19/2014 1:25 PM  Revision History     Date/Time User Provider Type Action   08/19/2014 1:25 PM Ranelle Oyster, MD Physician Cosign   08/19/2014 12:11 PM Standley Brooking, RN Rehab Admission Coordinator Sign   08/19/2014 12:10 PM Standley Brooking, RN Rehab Admission Coordinator Sign   View Details Report

## 2014-08-19 NOTE — Progress Notes (Signed)
Physical Therapy Treatment Patient Details Name: Kevin RudSteven L [name] MRN: [id number] DOB: 1975/12/28 Today's Date: 08/19/2014    History of Present Illness 39 yo male s/p Multiple pelvis fxs s/p ORIF(TDWB LLE) after falling off horse     PT Comments    Pt is making progress toward goals and demonstrated ability to hop on RLE using RW this session.  Pt required min assist supporting his LLE off of the floor to adhere to WB precautions.  Pt educated on donning technique for supportive underwear which he will attempt during next transfer w/ RN staff (RN notified) in an attempt to support his scrotum and dec his pain.  Pt is anticipating d/c to CIR today and will benefit from continued skilled PT.   Follow Up Recommendations  CIR;Supervision for mobility/OOB     Equipment Recommendations  3in1 (PT)    Recommendations for Other Services Rehab consult     Precautions / Restrictions Precautions Precautions: Fall Restrictions Weight Bearing Restrictions: Yes LLE Weight Bearing: Touchdown weight bearing    Mobility  Bed Mobility               General bed mobility comments: in recliner  Transfers Overall transfer level: Needs assistance Equipment used: Rolling walker (2 wheeled) Transfers: Sit to/from Stand Sit to Stand: Min assist;+2 safety/equipment;From elevated surface         General transfer comment: Max verbal cues to reach back for armrests during stand>sit for safety.  Pt requires assist to adhere to LLE WB status w/ assist bringing LLE anteriorly during sit>stand and during hop at side of chair.    Ambulation/Gait             General Gait Details: Hop on RLE standing in front of recliner chair only.  Pt is limited by severe pain in scrotum w/ hopping motion and therefore sit>stand transfer was practiced x5 from recliner chair   Stairs            Wheelchair Mobility    Modified Rankin (Stroke Patients Only)       Balance Overall balance assessment:  Needs assistance Sitting-balance support: Bilateral upper extremity supported;Feet supported Sitting balance-Leahy Scale: Poor     Standing balance support: Bilateral upper extremity supported;During functional activity Standing balance-Leahy Scale: Poor                      Cognition Arousal/Alertness: Awake/alert Behavior During Therapy: Agitated Overall Cognitive Status: Within Functional Limits for tasks assessed                      Exercises Total Joint Exercises Ankle Circles/Pumps: AROM;Both;10 reps;Supine Heel Slides: AAROM;Left;5 reps;Seated Long Arc Quad: AROM;Left;10 reps;Seated    General Comments General comments (skin integrity, edema, etc.): Pt reports L shoulder pain that he believes began after using trapeze in bed.  Pt has negative Leanord AsalHawkins Kennedy, Neers, and horizontal arm test.  R shoulder Abd 3/5 and painful.  Pt likely w/ strain of R deltoid and pt educated on importance of maintaining gentle mobility to decrease inflammation and trigger points from forming.   Educated pt on future use of supportive underwear and pad during next transfer w/ assist from RN, pt agrees to try this to support his scrotum w/ intentions to dec pain in scrotum to allow for increase in mobility.        Pertinent Vitals/Pain Pain Assessment: 0-10 Pain Score: 8  Pain Location: scrotum, RUE, L hip Pain Descriptors / Indicators:  Constant;Aching;Nagging Pain Intervention(s): Limited activity within patient's tolerance;Monitored during session;Repositioned;Ice applied    Home Living   Living Arrangements: Alone Available Help at Discharge: Available PRN/intermittently (Mom works 8-5 and can come home at lunch daily. Sisters will) Type of Home: Apartment Home Access: Other (comment) Entrance Stairs-Rails: None Home Layout: Other (Comment)   Additional Comments: pt lived in an apartment pta. plans to stay with Mom at d/c. He is letting apartment go    Prior Function             PT Goals (current goals can now be found in the care plan section) Acute Rehab PT Goals Patient Stated Goal: to go to rehab Progress towards PT goals: Progressing toward goals    Frequency  Min 5X/week    PT Plan Current plan remains appropriate    Co-evaluation             End of Session Equipment Utilized During Treatment: Gait belt Activity Tolerance: Patient limited by pain Patient left: in chair;with call bell/phone within reach     Time: 1330-1400 PT Time Calculation (min) (ACUTE ONLY): 30 min  Charges:  $Therapeutic Exercise: 8-22 mins $Therapeutic Activity: 8-22 mins                    G Codes:      Michail Jewels PT, DPT [phone] Pager: [phone] 08/19/2014, 2:22 PM

## 2014-08-19 NOTE — Progress Notes (Signed)
Physical Medicine and Rehabilitation Consult   Reason for Consult: Left acetabular fracture Referring Physician: [name].    HPI: [name] is a 39 y.o. male who was admitted on 08/11/14 after being thrown off his horse which subsequently landed on top of him. UDS positive for cocaine and THC. Patient was found to have severely comminuted both column acetabulum fracture and was placed in traction by Dr. August Saucerean. He was evaluated by Dr [name] and underwent ORIF left both column acetabular fracture with removal of tibial pin. Post op TDWB LLE X 12 weeks with recommendations for lovenox X 20 days followed by ASA. ABLA treated with 2 units PRBC. Patient has had issues with scrotal pain as well as abdominal discomfort due to ileus and diet modified to liquids. PT/OT evaluations done yesterday and CIR recommended for follow up therapy.   Patient states that he has difficulty sitting in a chair because it causes excess pressure on his scrotum. His main complaint in terms of pain is his scrotum.  Review of Systems  HENT: Negative for hearing loss.  Eyes: Negative for blurred vision and double vision.  Respiratory: Positive for shortness of breath (due to pain).  Cardiovascular: Negative for chest pain and palpitations.  Gastrointestinal: Positive for heartburn, abdominal pain and constipation. Negative for nausea.  Musculoskeletal: Positive for myalgias and back pain.  Neurological: Positive for weakness. Negative for dizziness, tingling and headaches.      Past Medical History  Diagnosis Date  . History of chicken pox   . History of kidney stones     Past Surgical History  Procedure Laterality Date  . Tonsillectomy    . Orif acetabular fracture Left 08/13/2014    Procedure: LEFT OPEN REDUCTION INTERNAL FIXATION (ORIF) ACETABULAR FRACTURE; Surgeon: [name], MD; [facility]; Service: Orthopedics; Laterality: Left;    Family History    Problem Relation Age of Onset  . Cancer Mother     ? cancer. Lymph nodes removed from breast, uterine tumor?  . Cancer Maternal Grandfather     prostate. Dx in his late 2370s  . Stroke Paternal Grandmother   . Heart disease Paternal Grandmother     cardiac stent ? valve replacement  . Cancer Paternal Grandfather     lung  . Pneumonia Paternal Grandfather     Social History: Lives alone but plans on d/c to mother's home. Mother works days. He works in [employer]. He reports that he quit smoking about 10 years ago. He does not have any smokeless tobacco history on file. He reports that he drinks alcohol on the weekends. . H/o of polysubstance abuse but reports that he quit use of marijuana, pain pills, and cocaine in 2009.    Allergies  Allergen Reactions  . Penicillins Hives    REACTION: childhood reaction    Medications Prior to Admission  Medication Sig Dispense Refill  . benzocaine (ORAJEL) 10 % mucosal gel Use as directed 1 application in the mouth or throat daily as needed for mouth pain.      Home: Home Living Family/patient expects to be discharged to:: Private residence (to mother's house at d/c) Living Arrangements: Parent Available Help at Discharge: Family, Available PRN/intermittently (mom and family works, but reports he knows people that can c) Type of Home: House Home Access: Stairs to enter Secretary/administratorntrance Stairs-Number of Steps: 3 Entrance Stairs-Rails: Can reach both Home Layout: One level Home Equipment: None (Pt has ordered RW and manual WC to be at home upon d/c) Additional  Comments: Above information is based on mother's house, patient states he plans to discharge there  Functional History: Prior Function Level of Independence: Independent Functional Status:  Mobility: Bed Mobility Overal bed mobility: Needs Assistance Bed Mobility: Rolling, Sidelying to Sit Rolling: Min assist Sidelying  to sit: Min assist, HOB elevated General bed mobility comments: in recliner Transfers Overall transfer level: Needs assistance Equipment used: Rolling walker (2 wheeled) Transfers: Sit to/from Stand Sit to Stand: Min assist, From elevated surface (pillows on recliner to elevate surface) General transfer comment: Cues to push from reciner rather than pull on RW. Cues to bring LLE out in front to adhere to WB precautions. Pt w/ increased pain in testicles while standing. Ambulation/Gait General Gait Details: not attempted this session    ADL: ADL Overall ADL's : Needs assistance/impaired Eating/Feeding: Set up, Bed level Grooming: Set up, Bed level Upper Body Bathing: Minimal assitance, Sitting Lower Body Bathing: Total assistance, Sitting/lateral leans, Cueing for safety Upper Body Dressing : Minimal assistance, Sitting Lower Body Dressing: Total assistance, Sitting/lateral leans, Cueing for safety General ADL Comments: Patient engaged in bed mobility (heavily relying on bed rails) and sat EOB. Patient min assist without UE support seated EOB (RLE supported). Patient able to perform sit<>stand with min assist and cues for TDWB > LLE. Patient with increased pain in testicles during bed mobility and sit<>stand transfers. Patient attempted stand pivot transfer with RW, but unable due to pain and WB restricitions. Therefore, patient performed squat pivot from bed > recliner > right side. Notified RN of needed pain medication and educated patient on elevation of LLE and testicles to help decrease pain.   Cognition: Cognition Overall Cognitive Status: Within Functional Limits for tasks assessed Orientation Level: Oriented X4 Cognition Arousal/Alertness: Awake/alert Behavior During Therapy: WFL for tasks assessed/performed Overall Cognitive Status: Within Functional Limits for tasks assessed  Blood pressure 113/75, pulse 113, temperature 98.1 F (36.7 C), temperature source Oral, resp.  rate 22, height  (1.651 m), weight 90.7 kg (199 lb 15.3 oz), SpO2 93 %. Physical Exam  Nursing note and vitals reviewed. Constitutional: He is oriented to person, place, and time. He appears well-developed and well-nourished.  Obese male up in chair. Panting due to pain  HENT:  Head: Normocephalic and atraumatic.  Eyes: Conjunctivae are normal. Pupils are equal, round, and reactive to light.  Neck: Normal range of motion. Neck supple.  Cardiovascular: Regular rhythm. Tachycardia present.  Respiratory: Effort normal. No accessory muscle usage. Tachypnea noted. No respiratory distress. He has decreased breath sounds.  GI: He exhibits distension. Bowel sounds are decreased. There is tenderness.  Genitourinary:  3+ scrotal edema with ecchymosis and tenderness with movement  Musculoskeletal:  Min edema LLE. BLE limited due to pain  Neurological: He is alert and oriented to person, place, and time.  Skin: Skin is warm and dry.  Psychiatric: His speech is normal. His mood appears anxious. He is withdrawn.  5/5 strength in bilateral deltoid, biceps, triceps, grip 4/5 right hip flexor and knee extensor ankle dorsi flexor plantar flexor Trace left hip flexor and extensor and dorsiflexor plantar flexor pain limited Has tenderness palpation over the left hip area extensive ecchymosis in the left hip and left thigh. Tenderness to palpation in the left thigh   Lab Results Last 24 Hours    Results for orders placed or performed during the hospital encounter of 08/11/14 (from the past 24 hour(s))  CBC Status: Abnormal   Collection Time: 08/15/14 6:00 AM  Result Value Ref Range  WBC 8.7 4.0 - 10.5 K/uL   RBC 2.70 (L) 4.22 - 5.81 MIL/uL   Hemoglobin 7.9 (L) 13.0 - 17.0 g/dL   HCT 40.9 (L) 81.1 - 91.4 %   MCV 83.7 78.0 - 100.0 fL   MCH 29.3 26.0 - 34.0 pg   MCHC 35.0 30.0 - 36.0 g/dL   RDW 78.2 95.6 - 21.3 %   Platelets 209 150 - 400 K/uL    TSH Status: None   Collection Time: 08/15/14 6:00 AM  Result Value Ref Range   TSH 1.717 0.350 - 4.500 uIU/mL  Magnesium Status: None   Collection Time: 08/15/14 6:00 AM  Result Value Ref Range   Magnesium 1.9 1.5 - 2.5 mg/dL  Phosphorus Status: Abnormal   Collection Time: 08/15/14 6:00 AM  Result Value Ref Range   Phosphorus 2.2 (L) 2.3 - 4.6 mg/dL  Comprehensive metabolic panel Status: Abnormal   Collection Time: 08/15/14 6:00 AM  Result Value Ref Range   Sodium 131 (L) 135 - 145 mmol/L   Potassium 3.5 3.5 - 5.1 mmol/L   Chloride 97 96 - 112 mmol/L   CO2 26 19 - 32 mmol/L   Glucose, Bld 120 (H) 70 - 99 mg/dL   BUN 8 6 - 23 mg/dL   Creatinine, Ser 0.86 0.50 - 1.35 mg/dL   Calcium 8.0 (L) 8.4 - 10.5 mg/dL   Total Protein 5.1 (L) 6.0 - 8.3 g/dL   Albumin 2.6 (L) 3.5 - 5.2 g/dL   AST 72 (H) 0 - 37 U/L   ALT 40 0 - 53 U/L   Alkaline Phosphatase 44 39 - 117 U/L   Total Bilirubin 0.9 0.3 - 1.2 mg/dL   GFR calc non Af Amer 84 (L) >90 mL/min   GFR calc Af Amer >90 >90 mL/min   Anion gap 8 5 - 15      Imaging Results (Last 48 hours)    Dg Pelvis Comp Min 3v  08/13/2014 CLINICAL DATA: Left pelvic fractures, operative fixation, acetabular fracture. EXAM: JUDET PELVIS - 3+ VIEW COMPARISON: 08/12/2014 FINDINGS: Plate and screw fixation present of the left iliac and acetabular fractures with improved alignment. Right hemipelvis and hips appear intact. SI joints are symmetric. No complicating feature. IMPRESSION: Improved alignment following ORIF of the left pelvic / acetabular fractures. Electronically Signed By: Judie Petit. Shick M.D. On: 08/13/2014 21:00   Dg Hip Operative Unilat With Pelvis Left  08/13/2014 CLINICAL DATA: ORIF left hip fracture EXAM: OPERATIVE LEFT HIP (WITH PELVIS IF PERFORMED) 7 VIEWS TECHNIQUE: Fluoroscopic spot image(s) were  submitted for interpretation post-operatively. FLUOROSCOPY TIME: If the device does not provide the exposure index: Fluoroscopy Time: 20secs Number of Acquired Images: 7 COMPARISON: None. FINDINGS: Multiple intraoperative fluoroscopic spot images are provided. There has been interval placement of 2 malleable plates transfixing a left acetabular fracture. There is a malleable plate transfixing a left ilium fracture. IMPRESSION: ORIF left acetabular and left ilium fracture. Electronically Signed By: Elige Ko On: 08/13/2014 18:35     Assessment/Plan: Diagnosis: Left acetabular fracture status post left ORIF 08/13/2014 with touchdown weightbearing left lower extremity. 1. Does the need for close, 24 hr/day medical supervision in concert with the patient's rehab needs make it unreasonable for this patient to be served in a less intensive setting? Yes 2. Co-Morbidities requiring supervision/potential complications: Ileus, scrotal hematoma, left thigh pain, acute blood loss anemia 3. Due to bladder management, bowel management, safety, skin/wound care, disease management, medication administration, pain management and patient education, does the patient require  24 hr/day rehab nursing? Yes 4. Does the patient require coordinated care of a physician, rehab nurse, PT (1-2 hrs/day, 5 days/week) and OT (112 hrs/day, 5 days/week) to address physical and functional deficits in the context of the above medical diagnosis(es)? Yes Addressing deficits in the following areas: balance, endurance, locomotion, strength, transferring, bowel/bladder control, bathing, dressing, feeding, grooming and toileting 5. Can the patient actively participate in an intensive therapy program of at least 3 hrs of therapy per day at least 5 days per week? not currently but expect he should be able to tolerate in the next 1-2 days 6. The potential for patient to make measurable gains while on inpatient rehab is  good 7. Anticipated functional outcomes upon discharge from inpatient rehab are modified independent and supervision with PT, modified independent and supervision with OT, n/a with SLP. 8. Estimated rehab length of stay to reach the above functional goals is: 12-16 days 9. Does the patient have adequate social supports and living environment to accommodate these discharge functional goals? Yes 10. Anticipated D/C setting: Home 11. Anticipated post D/C treatments: HH therapy 12. Overall Rehab/Functional Prognosis: excellent  RECOMMENDATIONS: This patient's condition is appropriate for continued rehabilitative care in the following setting: CIR Patient has agreed to participate in recommended program. YES Note that insurance prior authorization may be required for reimbursement for recommended care.  Comment: Patient has pain to palpation in the left thigh area, would recommend left thigh x-ray    08/15/2014       Revision History     Date/Time User Provider Type Action   08/15/2014 5:16 PM Erick Colace, MD Physician Sign   08/15/2014 5:13 PM Erick Colace, MD Physician Share   08/15/2014 11:36 AM Jacquelynn Cree, PA-C Physician Assistant Share   View Details Report       Routing History     Date/Time From To Method   08/15/2014 5:16 PM Erick Colace, MD Erick Colace, MD In Basket   08/15/2014 5:16 PM Erick Colace, MD Pcp Not In System In Basket

## 2014-08-19 NOTE — Discharge Summary (Signed)
Physician Discharge Summary  Patient ID: [name] MRN: [id number] DOB/AGE: November 23, 1975 39 y.o.  Admit date: 08/11/2014 Discharge date: 08/19/2014  Discharge Diagnoses Patient Active Problem List   Diagnosis Date Noted  . Fall from horse 08/13/2014  . Acute blood loss anemia 08/13/2014  . Pelvic fracture 08/12/2014  . HYPERGLYCEMIA, FASTING 06/29/2010    Consultants Drs. [name] and [name] for orthopedic surgery  Dr. Claudette [name] for PM&R   Procedures 4/25 -- Placement of left tibial traction pin by Dr. August Saucer  4/26 -- Open reduction internal fixation of left both column acetabular fracture and removal of tibial traction pin by Dr. Carola Frost   HPI: [name] was riding a horse when he fell off a horse and the horse fell on him. He denied any loss of consciousness but did report significant left hip pain. He was brought to the emergency room where a CT scan was obtained that demonstrated an iliac wing fracture and both column acetabular fracture on the left-hand side. He was admitted by orthopedic surgery and taken to the OR for placement of the traction pin. Trauma surgery was asked to consulted as well.   Hospital Course: Because of the anemia and high risk for ileus his attending was changed to the trauma service the next day. Orthopedic care was transferred to the traumatic orthopedic specialist who returned the patient to the OR for definitive treatment of his pelvis. He had a significant acute blood loss anemia and received 2 units of packed red blood cells. He was mobilized with physical and occupational therapies who recommended inpatient rehabilitation. They were consulted and agreed with admission. His pain was controlled on oral medications and he was tolerating a regular diet, not having developed the anticipated ileus. Once insurance approval had been obtained he was transferred there in good condition.   Inpatient Medications Scheduled Meds: . cholecalciferol   1,000 Units Oral BID  . docusate sodium  100 mg Oral BID  . enoxaparin (LOVENOX) injection  30 mg Subcutaneous Q12H  . gabapentin  300 mg Oral BID  . morphine  30 mg Oral 3 times per day  . polyethylene glycol  17 g Oral Daily  . vitamin C  500 mg Oral BID  . Vitamin D (Ergocalciferol)  50,000 Units Oral Q7 days   Continuous Infusions:  PRN Meds:.acetaminophen **OR** acetaminophen, alum & mag hydroxide-simeth, HYDROmorphone (DILAUDID) injection, methocarbamol, metoCLOPramide **OR** metoCLOPramide (REGLAN) injection, ondansetron **OR** ondansetron (ZOFRAN) IV, oxyCODONE  Home Medications   Medication List    TAKE these medications        benzocaine 10 % mucosal gel  Commonly known as:  ORAJEL  Use as directed 1 application in the mouth or throat daily as needed for mouth pain.     Cholecalciferol 1000 UNITS tablet  Take 1 tablet (1,000 Units total) by mouth 2 (two) times daily.     Vitamin D (Ergocalciferol) 50000 UNITS Caps capsule  Commonly known as:  DRISDOL  Take 1 capsule (50,000 Units total) by mouth every 7 (seven) days.            Follow-up Information    Follow up with HANDY,Kevin Sliva H, MD In 10 days.   Specialty:  Orthopedic Surgery   Why:  For wound re-check, For suture removal   Contact information:   [address] [place] [address] [phone]       Call [facility].   Why:  As needed   Contact information:  [address] [place] [id number] [phone]       Signed: Freeman CaldronMichael J. Moussa Wiegand, PA-C Pager: [phone] [facility] Pager: [phone] 08/19/2014, 10:42 AM

## 2014-08-19 NOTE — PMR Pre-admission (Signed)
PMR Admission Coordinator Pre-Admission Assessment  Patient: [name] is an 39 y.o., male MRN: [id number] DOB: 1975/09/18 Height: 5\' 5"  (165.1 cm) Weight: 90.7 kg (199 lb 15.3 oz)              Insurance Information HMO:     PPO:      PCP:      IPA:      80/20:      OTHER: [employer] PRIMARY: [employer] [place]      Policy#: [id number]      Subscriber: pt CM Name: Amy      Phone#: [phone] ext [phone]     Fax#: [phone] Pre-Cert#: [id number]      Employer: approved until 5/10. Updates due 5/9 Benefits:  Phone #: [phone]     Name: 4/29 Eff. Date: 11/17/13     Deduct: $1500      Out of Pocket Max: $3500      Life Max: none CIR: $250 copay per admi then covers 80%      SNF: 80% 60 days Outpatient: $20 copay per visit     Co-Pay: 30 visits combined Home Health: 80%      Co-Pay: no visit limit DME: 80%     Co-Pay: 20% Providers: in network  SECONDARY: none      Medicaid Application Date:       Case Manager:  Disability Application Date:       Case Worker:  Pt's sister has provided Dr. Magdalene PatriciaHandy's office his short term disability paperwork for completion on 08/16/14  Emergency Contact Information Contact Information    Name Relation Home Work Mobile   PettiboneFarrington,Jennie Sister [phone]     Gareth MorganJanney,Darlene Mother   [phone]   No name specified         Current Medical History  Patient Admitting Diagnosis: left acetabular fracture s/p ORIF  History of Present Illness: [name] is a 39 y.o. male who was admitted on 08/11/14 after being thrown off his horse which subsequently landed on top of him. UDS positive for cocaine and THC. Patient was found to have severely comminuted both column acetabulum fracture and was placed in traction by Dr. August Saucerean. He was evaluated by Dr [name] and underwent ORIF left both column acetabular fracture with removal of tibial pin. Post op TDWB LLE X 8 weeks with graduated WB thereafter. weeks with recommendations for lovenox X 20 days followed by  ASA. ABLA treated with 2 units PRBC. Patient has had issues with scrotal pain as well as abdominal discomfort due to ileus and diet modified to liquids.  Now on regular diet with pt having BM 08/17/14.  Patient states that he has difficulty sitting in a chair because it causes excess pressure on his scrotum. His main complaint in terms of pain is his scrotum. Using ice packs to assist with swelling.  5/2 pt complains of right shoulder sprain related to use of trapeze bar.  Past Medical History  Past Medical History  Diagnosis Date  . History of chicken pox   . History of kidney stones     Family History  family history includes Cancer in his maternal grandfather, mother, and paternal grandfather; Heart disease in his paternal grandmother; Pneumonia in his paternal grandfather; Stroke in his paternal grandmother.  Prior Rehab/Hospitalizations: none  Current Medications   Current facility-administered medications:  .  acetaminophen (TYLENOL) tablet 650 mg, 650 mg, Oral, Q6H PRN **OR** acetaminophen (TYLENOL) suppository 650 mg, 650 mg, Rectal, Q6H PRN, Mellody DanceKeith  Renae Fickle, PA-C .  alum & mag hydroxide-simeth (MAALOX/MYLANTA) 200-200-20 MG/5ML suspension 30 mL, 30 mL, Oral, PRN, Montez Morita, PA-C, 30 mL at 08/18/14 1249 .  cholecalciferol (VITAMIN D) tablet 1,000 Units, 1,000 Units, Oral, BID, Montez Morita, PA-C, 1,000 Units at 08/19/14 [phone]  docusate sodium (COLACE) capsule 100 mg, 100 mg, Oral, BID, Freeman Caldron, PA-C, 100 mg at 08/19/14 9604 .  enoxaparin (LOVENOX) injection 30 mg, 30 mg, Subcutaneous, Q12H, Freeman Caldron, PA-C, 30 mg at 08/19/14 5409 .  gabapentin (NEURONTIN) capsule 300 mg, 300 mg, Oral, BID, Montez Morita, PA-C, 300 mg at 08/19/14 0926 .  HYDROmorphone (DILAUDID) injection 0.5 mg, 0.5 mg, Intravenous, Q2H PRN, Eldred Manges, MD, 0.5 mg at 08/19/14 8119 .  methocarbamol (ROBAXIN) tablet 1,000 mg, 1,000 mg, Oral, Q6H PRN, Freeman Caldron, PA-C, 1,000 mg at 08/19/14 1153 .   metoCLOPramide (REGLAN) tablet 5-10 mg, 5-10 mg, Oral, Q8H PRN, 10 mg at 08/16/14 2048 **OR** metoCLOPramide (REGLAN) injection 5-10 mg, 5-10 mg, Intravenous, Q8H PRN, Montez Morita, PA-C .  morphine (MS CONTIN) 12 hr tablet 30 mg, 30 mg, Oral, 3 times per day, Freeman Caldron, PA-C, 30 mg at 08/19/14 1153 .  ondansetron (ZOFRAN) tablet 4 mg, 4 mg, Oral, Q6H PRN, 4 mg at 08/14/14 0621 **OR** ondansetron (ZOFRAN) injection 4 mg, 4 mg, Intravenous, Q6H PRN, Montez Morita, PA-C .  oxyCODONE (Oxy IR/ROXICODONE) immediate release tablet 10-20 mg, 10-20 mg, Oral, Q4H PRN, Freeman Caldron, PA-C, 20 mg at 08/19/14 1478 .  polyethylene glycol (MIRALAX / GLYCOLAX) packet 17 g, 17 g, Oral, Daily, Freeman Caldron, PA-C, 17 g at 08/19/14 2956 .  vitamin C (ASCORBIC ACID) tablet 500 mg, 500 mg, Oral, BID, Montez Morita, PA-C, 500 mg at 08/19/14 2130 .  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 A [employer]. Rides horses every Sunday. Not first Horse related accident. Admits to drinking and riding. Does not see issues with substance abuse with positive tox screen.  Home Assistive Devices / Equipment Home Assistive Devices/Equipment: None Home Equipment: None (Pt has ordered RW and manual WC to be at home upon d/c)  Pt states a friend has DME equipment that he can borrow.   Prior Functional Level Prior Function Level of Independence: Independent  Current Functional Level Cognition  Overall Cognitive Status: Within Functional Limits for tasks assessed Orientation Level: Oriented  X4    Extremity Assessment (includes Sensation/Coordination)  Upper Extremity Assessment: Defer to OT evaluation  Lower Extremity Assessment: LLE deficits/detail LLE Deficits / Details: weakness and limited ROM as expected s/p L hip ORIF LLE: Unable to fully assess due to pain    ADLs  Overall ADL's : Needs assistance/impaired Eating/Feeding: Set up, Bed level Grooming: Set up, Bed level Upper Body Bathing: Minimal assitance, Sitting Lower Body Bathing: Total assistance, Sitting/lateral leans, Cueing for safety Upper Body Dressing : Minimal assistance, Sitting Lower Body Dressing: Total assistance, Sitting/lateral leans, Cueing for safety Toilet Transfer: Set up Toilet Transfer Details (indicate cue type and reason): pt. requests to use urinal eob as he reports he is still limited by swelling of his scrotum Toileting- Clothing Manipulation and  Hygiene: Set up, Sitting/lateral lean General ADL Comments: agreeable to bed mobility and eob, states he sits eob and it is more comfortable than recliner due to scrotum inflammation     Mobility  Overal bed mobility: Needs Assistance Bed Mobility: Supine to Sit Rolling: Min assist Sidelying to sit: Min assist, HOB elevated Supine to sit: Min assist, HOB elevated Sit to supine: Min assist, HOB elevated General bed mobility comments: min A for managing LLE and  RLE to get to sitting EOB, heavy use of bed rails, HOB elevated.  pt. states R UE strained due to heavy use of trapeze bar.  reviewed benefits of not relying on it, even with him recognizing it has hurt his arm he continued to insist on trying to use it    Transfers  Overall transfer level: Needs assistance Equipment used: Rolling walker (2 wheeled) Transfers: Sit to/from Stand, Stand Pivot Transfers Sit to Stand: Min assist, +2 safety/equipment, From elevated surface Stand pivot transfers: Min assist, +2 safety/equipment General transfer comment: declined transfer as he wanted to  wait for his pain meds and also states recliner is not comfortable    Ambulation / Gait / Stairs / Wheelchair Mobility  Ambulation/Gait General Gait Details: pivotal steps only    Posture / Balance Balance Overall balance assessment: Needs assistance Sitting-balance support: Feet supported, Bilateral upper extremity supported Sitting balance-Leahy Scale: Poor Standing balance support: Bilateral upper extremity supported, During functional activity Standing balance-Leahy Scale: Poor    Special needs/care consideration Skin on 4/30 noted rash on upper back.  Bowel mgmt: 4/28 pt on clears diet due to abdominal distention and per Dr. Carola Frost slight ileus. Now on regular diet, passing flatus last BM 4/30. Pt reports two BMs since admission. Bladder mgmt: continent  Pt requesting SW write him a note to give to his lawyer. He has an upcoming court date and he questions whether he will be able to appear. Ortho MD has previously told him he should be able to appear in court . Court date not set yet.    Previous Home Environment Living Arrangements: Alone  Lives With: Alone Available Help at Discharge: Available PRN/intermittently (Mom works 8-5 and can come home at lunch daily. Sisters will) Type of Home: Apartment Home Layout: Other (Comment) Home Access: Other (comment) Entrance Stairs-Rails: None Entrance Stairs-Number of Steps: n/a Bathroom Shower/Tub: Tub/shower unit, Engineer, building services: Standard Home Care Services: No Additional Comments: pt lived in an apartment pta. plans to stay with Mom at d/c. He is letting apartment go  Discharge Living Setting Plans for Discharge Living Setting: Other (Comment) (Plans to go stay with his Mom at d/c) Type of Home at Discharge: House Discharge Home Layout: One level Discharge Home Access: Stairs to enter Entrance Stairs-Rails: Right, Left, Can reach both Entrance Stairs-Number of Steps: 4 Discharge Bathroom Shower/Tub: Tub/shower unit,  Curtain Discharge Bathroom Toilet: Standard Discharge Bathroom Accessibility: Yes How Accessible: Accessible via walker Does the patient have any problems obtaining your medications?: No  Social/Family/Support Systems Patient Roles: Other (Comment) 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 Comes home at lunch, sisters assist when not working Caregiver Availability: Intermittent Discharge Plan Discussed with Primary Caregiver: Yes Is Caregiver In Agreement with Plan?: Yes Does Caregiver/Family have Issues with Lodging/Transportation while Pt is in Rehab?: No  Goals/Additional Needs Patient/Family Goal for Rehab: Mod I to supervision with PT, Mod  I to supervision with OT Expected length of stay: ELOS 12- 16 days Pt/Family Agrees to Admission and willing to participate: Yes Program Orientation Provided & Reviewed with Pt/Caregiver Including Roles  & Responsibilities: Yes   Decrease burden of Care through IP rehab admission: n/a  Possible need for SNF placement upon discharge:not anticipated  Patient Condition: This patient's medical and functional status has changed since the consult dated: 08/15/2014 in which the Rehabilitation Physician determined and documented that the patient's condition is appropriate for intensive rehabilitative care in an inpatient rehabilitation facility. See "History of Present Illness" (above) for medical update. Functional changes are: mod assist pivotal steps for gait only. Patient's medical and functional status update has been discussed with the Rehabilitation physician and patient remains appropriate for inpatient rehabilitation. Will admit to inpatient rehab today.  Preadmission Screen Completed By:  Clois Dupes, 08/19/2014 12:10  PM ______________________________________________________________________   Discussed status with Dr. Riley Kill on 08/19/2014 at  1210  and received telephone approval for admission today.  Admission Coordinator:  Clois Dupes, time 1210 Date 08/19/2014.

## 2014-08-19 NOTE — Progress Notes (Signed)
Occupational Therapy Treatment Patient Details Name: [name] MRN: [id number] DOB: 1975-05-16 Today's Date: 08/19/2014    History of present illness 39 yo male s/p Multiple pelvis fxs s/p ORIF(TDWB LLE) after falling off horse    OT comments  Pt. Remains motivated and eager to move to CIR level therapies.  Improvement with bed mobility as swelling is still present but has subsided enough to allow B LES to move "a little easier" per pt. Report.  Agree with current recommendations for CIR level therapies. Pt. 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  Follow Up Recommendations  CIR;Supervision/Assistance - 24 hour    Equipment Recommendations       Recommendations for Other Services Rehab consult    Precautions / Restrictions Precautions Precautions: Fall Restrictions LLE Weight Bearing: Touchdown weight bearing       Mobility Bed Mobility Overal bed mobility: Needs Assistance Bed Mobility: Supine to Sit     Supine to sit: Min assist;HOB elevated     General bed mobility comments: min A for managing LLE and  RLE to get to sitting EOB, heavy use of bed rails, HOB elevated.  pt. states R UE strained due to heavy use of trapeze bar.  reviewed benefits of not relying on it, even with him recognizing it has hurt his arm he continued to insist on trying to use it  Transfers                 General transfer comment: declined transfer as he wanted to wait for his pain meds and also states recliner is not comfortable    Balance                                   ADL Overall ADL's : Needs assistance/impaired                         Toilet Transfer: Set up Toilet Transfer Details (indicate cue type and reason): pt. requests to use urinal eob as he reports he is still limited by swelling of his scrotum Toileting- Clothing Manipulation and Hygiene: Set  up;Sitting/lateral lean         General ADL Comments: agreeable to bed mobility and eob, states he sits eob and it is more comfortable than recliner due to scrotum inflammation       Vision                     Perception     Praxis      Cognition   Behavior During Therapy: Agitated Overall Cognitive Status: Within Functional Limits for tasks assessed                       Extremity/Trunk Assessment               Exercises     Shoulder Instructions       General Comments      Pertinent Vitals/ Pain       Pain Assessment:  (did not rate but continued to say he needed pain meds) Pain Location: scrotum area and R UE Pain Descriptors / Indicators: Aching;Constant;Sharp Pain Intervention(s): Monitored during session;Repositioned;Patient requesting pain meds-RN notified  Home Living  Prior Functioning/Environment              Frequency       Progress Toward Goals  OT Goals(current goals can now be found in the care plan section)  Progress towards OT goals: Progressing toward goals     Plan Discharge plan remains appropriate    Co-evaluation                 End of Session     Activity Tolerance Patient limited by pain   Patient Left in bed;with call bell/phone within reach   Nurse Communication Other (comment) (informed CNA pt. is sitting eob per his request and will call for A when ready for back to bed)        Time: 1610-9604 OT Time Calculation (min): 14 min  Charges: OT General Charges $OT Visit: 1 Procedure OT Treatments $Self Care/Home Management : 8-22 mins  Robet Leu, COTA/L 08/19/2014, 9:05 AM

## 2014-08-19 NOTE — Progress Notes (Signed)
Trauma Service Note  Subjective: Patient a bit impatient about not going to Rehab.  Stable otherwise.  Objective: Vital signs in last 24 hours: Temp:  [98.4 F (36.9 C)-100 F (37.8 C)] 99.3 F (37.4 C) (05/02 0432) Pulse Rate:  [102-109] 102 (05/02 0432) Resp:  [17-18] 18 (05/02 0432) BP: (104-124)/(64-81) 108/64 mmHg (05/02 0432) SpO2:  [99 %-100 %] 99 % (05/02 0432) Last BM Date: 08/17/14  Intake/Output from previous day: 05/01 0701 - 05/02 0700 In: 120 [P.O.:120] Out: 2700 [Urine:2700] Intake/Output this shift:    General: No acute distress  Lungs: Clear  Abd: Benign  Extremities: Intact.  Ambulating with PT  Neuro: Intact  Lab Results: CBC   Recent Labs  08/17/14 0422 08/18/14 1435  WBC 5.8 6.2  HGB 7.7* 8.2*  HCT 22.4* 25.0*  PLT 237 294   BMET No results for input(s): NA, K, CL, CO2, GLUCOSE, BUN, CREATININE, CALCIUM in the last 72 hours. PT/INR No results for input(s): LABPROT, INR in the last 72 hours. ABG No results for input(s): PHART, HCO3 in the last 72 hours.  Invalid input(s): PCO2, PO2  Studies/Results: No results found.  Anti-infectives: Anti-infectives    Start     Dose/Rate Route Frequency Ordered Stop   08/15/14 0600  clindamycin (CLEOCIN) capsule 300 mg     300 mg Oral 3 times per day 08/13/14 1849 08/17/14 1610   08/13/14 1900  clindamycin (CLEOCIN) IVPB 600 mg     600 mg 100 mL/hr over 30 Minutes Intravenous Every 6 hours 08/13/14 1849 08/14/14 0936   08/13/14 0800  clindamycin (CLEOCIN) IVPB 600 mg     600 mg 100 mL/hr over 30 Minutes Intravenous On call to O.R. 08/12/14 1044 08/13/14 1315   08/12/14 1400  [MAR Hold]  clindamycin (CLEOCIN) capsule 300 mg  Status:  Discontinued     (MAR Hold since 08/13/14 1026)   300 mg Oral 3 times per day 08/12/14 1041 08/13/14 1849   08/12/14 0600  clindamycin (CLEOCIN) IVPB 900 mg  Status:  Discontinued     900 mg 100 mL/hr over 30 Minutes Intravenous On call to O.R. 08/12/14 0144  08/13/14 0924      Assessment/Plan: s/p Procedure(s): LEFT OPEN REDUCTION INTERNAL FIXATION (ORIF) ACETABULAR FRACTURE Can go to Rehab at anytime.  LOS: 7 days   Marta LamasJames O. Gae BonWyatt, III, MD, FACS [phone] Trauma Surgeon 08/19/2014

## 2014-08-19 NOTE — Progress Notes (Signed)
UR completed.  Pt to CIR today.  Marthann Abshier BrysCarlyle Lipaon, RN BSN MHA CCM Trauma/Neuro ICU Case Manager [phone]

## 2014-08-20 ENCOUNTER — Inpatient Hospital Stay (HOSPITAL_COMMUNITY): Payer: PRIVATE HEALTH INSURANCE | Admitting: Occupational Therapy

## 2014-08-20 ENCOUNTER — Inpatient Hospital Stay (HOSPITAL_COMMUNITY): Payer: PRIVATE HEALTH INSURANCE | Admitting: Rehabilitation

## 2014-08-20 DIAGNOSIS — G8918 Other acute postprocedural pain: Secondary | ICD-10-CM

## 2014-08-20 DIAGNOSIS — S32402S Unspecified fracture of left acetabulum, sequela: Secondary | ICD-10-CM

## 2014-08-20 LAB — CBC WITH DIFFERENTIAL/PLATELET
BASOS ABS: 0 10*3/uL (ref 0.0–0.1)
Basophils Relative: 0 % (ref 0–1)
EOS PCT: 3 % (ref 0–5)
Eosinophils Absolute: 0.2 10*3/uL (ref 0.0–0.7)
HEMATOCRIT: 25.4 % — AB (ref 39.0–52.0)
Hemoglobin: 8.1 g/dL — ABNORMAL LOW (ref 13.0–17.0)
LYMPHS ABS: 1.1 10*3/uL (ref 0.7–4.0)
LYMPHS PCT: 17 % (ref 12–46)
MCH: 27.4 pg (ref 26.0–34.0)
MCHC: 31.9 g/dL (ref 30.0–36.0)
MCV: 85.8 fL (ref 78.0–100.0)
Monocytes Absolute: 0.6 10*3/uL (ref 0.1–1.0)
Monocytes Relative: 9 % (ref 3–12)
NEUTROS ABS: 4.7 10*3/uL (ref 1.7–7.7)
Neutrophils Relative %: 71 % (ref 43–77)
Platelets: 294 10*3/uL (ref 150–400)
RBC: 2.96 MIL/uL — ABNORMAL LOW (ref 4.22–5.81)
RDW: 14.9 % (ref 11.5–15.5)
WBC: 6.6 10*3/uL (ref 4.0–10.5)

## 2014-08-20 LAB — COMPREHENSIVE METABOLIC PANEL
ALT: 50 U/L (ref 17–63)
AST: 33 U/L (ref 15–41)
Albumin: 2.8 g/dL — ABNORMAL LOW (ref 3.5–5.0)
Alkaline Phosphatase: 71 U/L (ref 38–126)
Anion gap: 10 (ref 5–15)
BILIRUBIN TOTAL: 1.6 mg/dL — AB (ref 0.3–1.2)
BUN: 11 mg/dL (ref 6–20)
CO2: 26 mmol/L (ref 22–32)
CREATININE: 0.83 mg/dL (ref 0.61–1.24)
Calcium: 8.7 mg/dL — ABNORMAL LOW (ref 8.9–10.3)
Chloride: 96 mmol/L — ABNORMAL LOW (ref 101–111)
GLUCOSE: 110 mg/dL — AB (ref 70–99)
Potassium: 4 mmol/L (ref 3.5–5.1)
Sodium: 132 mmol/L — ABNORMAL LOW (ref 135–145)
Total Protein: 5.9 g/dL — ABNORMAL LOW (ref 6.5–8.1)

## 2014-08-20 LAB — TESTOSTERONE, % FREE: Testosterone-% Free: 2.1 % — ABNORMAL HIGH (ref 1.6–2.9)

## 2014-08-20 LAB — SEX HORMONE BINDING GLOBULIN: SEX HORMONE BINDING: 26 nmol/L (ref 10–50)

## 2014-08-20 LAB — TESTOSTERONE, FREE: Testosterone, Free: 20.5 pg/mL — ABNORMAL LOW (ref 47.0–244.0)

## 2014-08-20 MED ORDER — METOCLOPRAMIDE HCL 10 MG PO TABS
10.0000 mg | ORAL_TABLET | Freq: Three times a day (TID) | ORAL | Status: DC
  Administered 2014-08-20 – 2014-08-21 (×4): 10 mg via ORAL
  Filled 2014-08-20 (×8): qty 1

## 2014-08-20 NOTE — Progress Notes (Signed)
Updated patient on KUB.  He denies any abdominal pain or nausea. Poor appetite due to food. Reports that he had BM yesterday and passing flatus.  Abdominal exam with continued distension but no pain and +BS. Added reglan to help with ileus. Will check follow up KUB in am.

## 2014-08-20 NOTE — Progress Notes (Signed)
Patient information reviewed and entered into eRehab system by Aliviya Schoeller, RN, CRRN, PPS Coordinator.  Information including medical coding and functional independence measure will be reviewed and updated through discharge.    

## 2014-08-20 NOTE — Progress Notes (Signed)
Occupational Therapy Session Note  Patient Details  Name: [name] MRN: [id number] Date of Birth: 06-Apr-1976  Today's Date: 08/20/2014 OT Individual Time: 1301-1402 OT Individual Time Calculation (min): 61 min    Skilled Therapeutic Interventions/Progress Updates:   Pt transferred supine to sit EOB with min instructional cueing and mod assist to advance the LLE to the edge of the bed.  Began session with education on AE for LB selfcare.  He was able to donn and doff his socks with setup of AE.  He was able to complete transfer to the wheelchair with min assist but exhibits increased trunk flexion and decreased upright standing.  Rolled himself to the ADL apartment to practice tub shower transfers.  Min assist for actual transfer with mod assist for therapist to bring the LLE over the edge of the tub going in and also coming out.  Discussed cost of seat for home use.  Returned to room at end of session with pt transferring back to bed with min assist and use of the leg lifter to reposition the LLE in bed.   Therapy Documentation Precautions:  Precautions Precautions: Fall Precaution Comments: has strain in R shoulder from trapeze use Restrictions Weight Bearing Restrictions: Yes LLE Weight Bearing: Touchdown weight bearing Other Position/Activity Restrictions: Needs continuous cues to maintain WB status  Pain: Pain Assessment Pain Assessment: 0-10 Pain Score: 2  Pain Type: Acute pain;Surgical pain Pain Location: Hip Pain Orientation: Left Pain Descriptors / Indicators: Aching Pain Onset: On-going Patients Stated Pain Goal: 1 Pain Intervention(s): Medication (See eMAR);Cold applied ADL: See FIM for current functional status  Therapy/Group: Individual Therapy  [name] OTR/L 08/20/2014, 4:44 PM

## 2014-08-20 NOTE — Progress Notes (Signed)
Social Work Assessment and Plan  Patient Details  Name: [name] MRN: [id number] Date of Birth: 1975/07/22  Today's Date: 08/20/2014  Problem List:  Patient Active Problem List   Diagnosis Date Noted  . Vitamin D insufficiency 08/19/2014  . Testosterone deficiency 08/19/2014  . Acetabular fracture 08/19/2014  . Fall from horse 08/13/2014  . Acute blood loss anemia 08/13/2014  . Pelvic fracture 08/12/2014  . HYPERGLYCEMIA, FASTING 06/29/2010   Past Medical History:  Past Medical History  Diagnosis Date  . History of chicken pox   . History of kidney stones    Past Surgical History:  Past Surgical History  Procedure Laterality Date  . Tonsillectomy    . Orif acetabular fracture Left 08/13/2014    Procedure: LEFT OPEN REDUCTION INTERNAL FIXATION (ORIF) ACETABULAR FRACTURE;  Surgeon: Altamese St. Martin, MD;  Location: [place];  Service: Orthopedics;  Laterality: Left;   Social History:  reports that he quit smoking about 10 years ago. He does not have any smokeless tobacco history on file. He reports that he drinks alcohol. He reports that he does not use illicit drugs.  Family / Support Systems Other Supports: Jacklyn Shell - mother - [phone];  Pincus Badder - sister - [phone];  Colletta Maryland - sister who is an Therapist, sports at [employer] in [place] Anticipated Caregiver: Mom and sisters Ability/Limitations of Caregiver: Mom works 8-5. Comes home at lunch, sisters assist when not working Caregiver Availability: Intermittent Family Dynamics: Supportive family  Social History Preferred language: English Religion: Non-Denominational Read: Yes Write: Yes Employment Status: Employed Name of Employer: [employer];  also drives a truck [employer] of Employment: 1 Return to Work Plans: pt wants to return to work as soon as he is able to Public relations account executive Issues: Pt does have a pending court case, but he is unsure when he needs  to return to court.  He may need a letter showing that he's had an accident and has been hospitalized. Guardian/Conservator: N/A   Abuse/Neglect Physical Abuse: Denies Verbal Abuse: Denies Sexual Abuse: Denies Exploitation of patient/patient's resources: Denies Self-Neglect: Denies  Emotional Status Pt's affect, behavior and adjustment status: Pt is understandably frustrated by his accident and the impact it's had on him, but he remains motivated to get better and is not experiencing any signs/symptoms of depression or anxiety. Recent Psychosocial Issues: None reported, besides pending court case Psychiatric History: None reported Substance Abuse History: Pt reports marijuana use and tried cocaine at a party.  He denies any ongoing problems and does not want resources to stop using illicit drugs.  Patient / Family Perceptions, Expectations & Goals Pt/Family understanding of illness & functional limitations: Pt feels he has a good understanding of his condition and his limitations.   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 Anticipated changes in roles/activities/participation: Pt realizes he will be out of work for a while, but hopes to get back as soon as he can.  He is going to move in with his mother termporarily. Pt/family expectations/goals: Pt wants to recover and walk again and get back to work and riding his horse.  Community Resources Express Scripts: None Premorbid Home Care/DME Agencies: None Transportation available at discharge: family  Discharge Planning Living Arrangements: Alone Support Systems: Water engineer, Other relatives Type of Residence: Private residence Insurance Resources: Multimedia programmer (specify) (Blue Cross Blue Shield) Museum/gallery curator Resources: Employment Financial Screen Referred: No Living Expenses:  Lives with family Money Management: Patient Does the patient have  any problems obtaining your medications?: No Home Management: Pt is going to his mother's home, so she will be able to manage the household. Patient/Family Preliminary Plans: Pt plans to go to his mother's home, where she will be with pt at night and sisters will check on pt during the day. Barriers to Discharge: Steps, Self care Social Work Anticipated Follow Up Needs: HH/OP Expected length of stay: 7 to 9 days  Clinical Impression CSW met with pt to introduce self and role of CSW, as well as to complete assessment.  Pt plans to go to his mother's home at d/c where she and his sisters will provide intermittent supervision.  Pt is motivated to get well and although he is frustrated by the situation, pt is doing well emotionally.  Pt stated that he does not need resources for substance abuse, as he does not recognize it as a problem, only using cocaine socially at a party.  Pt knows he cannot continue drug use, as it would put his jobs at risk due to random drug screening.  CSW offered support and resources should pt change his mind.  Pt's employer has been understanding during this time and sister has obtained short term disability paperwork for him.  Pt hopes to return to his jobs when able.  One of pt's sisters is a Therapist, sports at [employer] in [place].  CSW will continue to follow pt and assist as needed.  Talmage Teaster, Silvestre Mesi 08/20/2014, 1:09 PM

## 2014-08-20 NOTE — Progress Notes (Signed)
[facility] Individual Statement of Services  Patient Name:  [name]  Date:  08/20/2014  Welcome to the [facility].  Our goal is to provide you with an individualized program based on your diagnosis and situation, designed to meet your specific needs.  With this comprehensive rehabilitation program, you will be expected to participate in at least 3 hours of rehabilitation therapies Monday-Friday, with modified therapy programming on the weekends.  Your rehabilitation program will include the following services:  Physical Therapy (PT), Occupational Therapy (OT), 24 hour per day rehabilitation nursing, Case Management (Social Worker), Rehabilitation Medicine, Nutrition Services and Pharmacy Services  Weekly team conferences will be held on Wednesdays to discuss your progress.  Your Social Worker will talk with you frequently to get your input and to update you on team discussions.  Team conferences with you and your family in attendance may also be held.  Expected length of stay:  7 to 9 days  Overall anticipated outcome:  Modified Independent with supervision for stairs and car transfers  Depending on your progress and recovery, your program may change. Your Social Worker will coordinate services and will keep you informed of any changes. Your Social Worker's name and contact numbers are listed  below.  The following services may also be recommended but are not provided by the [facility]patient [facility]:   Driving Evaluations  Home Health Rehabiltiation Services  Outpatient Rehabilitation Services  Vocational Rehabilitation   Arrangements will be made to provide these services after discharge if needed.  Arrangements include referral to agencies that provide these services.  Your insurance has been verified to be:  [employer] Blue Shield Your [facility]   Pertinent information will be  shared with your doctor and your insurance company.  Social Worker:  Staci AcostaJenny Vaun Hyndman, LCSW  [phone] or ([phone]) [phone]  Information discussed with and copy given to patient by: Elvera LennoxPrevatt, Grant Swager Capps, 08/20/2014, 1:19 PM

## 2014-08-20 NOTE — Evaluation (Signed)
Physical Therapy Assessment and Plan  Patient Details  Name: [name] MRN: [id number] Date of Birth: 07-29-1975  PT Diagnosis: Abnormality of gait, Difficulty walking, Muscle weakness and Pain in L hip and groin Rehab Potential: Excellent ELOS: 7-9 days    Today's Date: 08/20/2014 PT Individual Time: 9798-9211 PT Individual Time Calculation (min): 72 min    Problem List:  Patient Active Problem List   Diagnosis Date Noted  . Vitamin D insufficiency 08/19/2014  . Testosterone deficiency 08/19/2014  . Acetabular fracture 08/19/2014  . Fall from horse 08/13/2014  . Acute blood loss anemia 08/13/2014  . Pelvic fracture 08/12/2014  . HYPERGLYCEMIA, FASTING 06/29/2010    Past Medical History:  Past Medical History  Diagnosis Date  . History of chicken pox   . History of kidney stones    Past Surgical History:  Past Surgical History  Procedure Laterality Date  . Tonsillectomy    . Orif acetabular fracture Left 08/13/2014    Procedure: LEFT OPEN REDUCTION INTERNAL FIXATION (ORIF) ACETABULAR FRACTURE;  Surgeon: Altamese Hallett, MD;  Location: [facility];  Service: Orthopedics;  Laterality: Left;    Assessment & Plan Clinical Impression: Patient is a 39 y.o. year old male with was admitted on 08/11/14 after being thrown off his horse which subsequently landed on top of him. UDS positive for cocaine and THC. Patient was found to have severely comminuted both column acetabulum fracture and was placed in traction by Dr. Marlou Sa. He was evaluated by Dr Marcelino Scot and underwent ORIF left both column acetabular fracture with removal of tibial pin. Post op TDWB LLE X 12 weeks with recommendations for lovenox X 20 days followed by ASA bid. ABLA treated with 2 units PRBC. Patient has had issues with scrotal pain as well as abdominal discomfort due to ileus and was started on bowel program without results? He had had recent tooth abscess treated with clindamycin. Low Vitamin D levels likely due to  cannabis use and supplementation recommended by ortho.  Patient transferred to CIR on 08/19/2014 .   Patient currently requires min with mobility secondary to muscle weakness and decreased cardiorespiratoy endurance.  Prior to hospitalization, patient was independent  with mobility and lived with Family in a House home.  Home access is 3Stairs to enter.  Patient will benefit from skilled PT intervention to maximize safe functional mobility, minimize fall risk and decrease caregiver burden for planned discharge home with intermittent assist.  Anticipate patient will benefit from follow up [facility] at discharge.  PT - End of Session Activity Tolerance: Tolerates 30+ min activity with multiple rests Endurance Deficit: Yes Endurance Deficit Description: Pt needing multiple rest breaks due to fatigue and pain.  PT Assessment Rehab Potential (ACUTE/IP ONLY): Excellent PT Patient demonstrates impairments in the following area(s): Balance;Endurance;Edema;Motor;Pain;Safety PT Transfers Functional Problem(s): Bed Mobility;Bed to Chair;Car;Furniture PT Locomotion Functional Problem(s): Ambulation;Wheelchair Mobility;Stairs (w/c mobility for endurance) PT Plan PT Intensity: Minimum of 1-2 x/day ,45 to 90 minutes PT Frequency: 5 out of 7 days PT Duration Estimated Length of Stay: 7-9 days  PT Treatment/Interventions: Ambulation/gait training;Community reintegration;Discharge planning;DME/adaptive equipment instruction;Functional mobility training;Pain management;Patient/family education;Psychosocial support;Stair training;Therapeutic Activities;Therapeutic Exercise;UE/LE Strength taining/ROM;Wheelchair propulsion/positioning PT Transfers Anticipated Outcome(s): mod I  PT Locomotion Anticipated Outcome(s): mod I at an ambulatory level (S stairs) PT Recommendation Follow Up Recommendations: Home health PT Patient destination: Home Equipment Recommended: Rolling walker with 5" wheels Equipment Details: to be  determined if pt needing w/c  Skilled Therapeutic Intervention PT evaluation and assessment performed, see full details  below.  Initiated gait training with use of RW, stair negotiation w/ RW as well as bed mobility with use of leg lifter.  Pt unable to get into bed on R side of bed as he would at home, therefore needed to get into bed on L side with improvement, however still needed A for BLEs into bed for safety and pain control.  Discussed ELOS, expected outcomes, equipment and rehab schedule.  Pt verbalized understanding.  Propelled back to room and left in w/c with all needs in reach.   PT Evaluation Precautions/Restrictions Precautions Precautions: Fall Precaution Comments: has strain in R shoulder from trapeze use Restrictions Weight Bearing Restrictions: No LLE Weight Bearing: Touchdown weight bearing Other Position/Activity Restrictions: Needs continuous cues to maintain WB status General Chart Reviewed: Yes Family/Caregiver Present: No Vital Signs  Pain Pain Assessment Pain Assessment: 0-10 Pain Score: 6  Pain Type: Acute pain;Surgical pain Pain Location: Hip Pain Orientation: Left Pain Descriptors / Indicators: Aching Pain Intervention(s): Medication (See eMAR) Home Living/Prior Functioning Home Living Available Help at Discharge: Family (going home with mom, works from 8-5 during day) Type of Home: House Home Access: Stairs to enter Technical brewer of Steps: 3 Entrance Stairs-Rails: Right;Left;Can reach both Home Layout: One level  Lives With: Family Prior Function Level of Independence: Independent with basic ADLs;Independent with gait;Independent with transfers  Able to Take Stairs?: Yes Driving: Yes Vocation: Full time employment Leisure: Hobbies-yes (Comment) Comments: likes to ride horses, hanging out with friends    Cognition Overall Cognitive Status: Within Functional Limits for tasks assessed Arousal/Alertness: Awake/alert Orientation Level:  Oriented X4 Attention: Alternating Alternating Attention: Appears intact Memory: Appears intact Awareness: Impaired Awareness Impairment: Anticipatory impairment (with WB precautions) Behaviors: Impulsive Safety/Judgment: Appears intact Comments: Somewhat impulsive during mobility  Sensation Sensation Light Touch: Appears Intact Stereognosis: Not tested Hot/Cold: Not tested Proprioception: Appears Intact Coordination Gross Motor Movements are Fluid and Coordinated: No Fine Motor Movements are Fluid and Coordinated: No Coordination and Movement Description: Pt with limited coordination in LEs due to pain and strength deficits Motor  Motor Motor: Other (comment) Motor - Skilled Clinical Observations: decreased overall strength, greatly limited by pain  Mobility Bed Mobility Bed Mobility: Supine to Sit;Sit to Supine Supine to Sit: 3: Mod assist Supine to Sit Details: Verbal cues for sequencing;Verbal cues for technique;Verbal cues for precautions/safety;Manual facilitation for weight shifting;Manual facilitation for placement Sit to Supine: 3: Mod assist Sit to Supine - Details: Verbal cues for sequencing;Verbal cues for technique;Verbal cues for precautions/safety;Manual facilitation for weight shifting;Manual facilitation for placement Transfers Transfers: Yes Sit to Stand: 4: Min guard Stand to Sit: 4: Min guard Stand Pivot Transfers: 4: Min Psychologist, occupational Details: Verbal cues for sequencing;Verbal cues for technique;Verbal cues for precautions/safety;Verbal cues for safe use of DME/AE;Verbal cues for gait pattern Locomotion  Ambulation Ambulation: Yes Ambulation/Gait Assistance: 4: Min assist Ambulation Distance (Feet): 15 Feet Assistive device: Rolling walker Gait Gait: Yes Gait Pattern: Impaired Gait Pattern: Step-to pattern;Decreased step length - left;Decreased stance time - left;Decreased hip/knee flexion - left;Trunk flexed;Poor foot clearance -  left;Antalgic Stairs / Additional Locomotion Stairs: Yes Stairs Assistance: 4: Min assist;3: Mod assist Stairs Assistance Details: Verbal cues for sequencing;Verbal cues for technique;Verbal cues for precautions/safety;Verbal cues for gait pattern;Verbal cues for safe use of DME/AE;Manual facilitation for weight shifting Stair Management Technique: No rails;With walker;Step to pattern;Backwards;Forwards Number of Stairs: 1 Height of Stairs: 4 Architect: Yes Wheelchair Assistance: 5: Supervision Wheelchair Assistance Details: Verbal cues for sequencing;Verbal cues  for technique;Verbal cues for precautions/safety Wheelchair Propulsion: Both upper extremities Wheelchair Parts Management: Needs assistance Distance: 100  Trunk/Postural Assessment  Cervical Assessment Cervical Assessment: Within Functional Limits Thoracic Assessment Thoracic Assessment: Within Functional Limits Lumbar Assessment Lumbar Assessment: Within Functional Limits Postural Control Postural Control: Deficits on evaluation Postural Limitations: Note forward flexed posture during all upright mobility, decreased balance due to pain and precautions in LLE.   Balance Balance Balance Assessed: Yes Static Sitting Balance Static Sitting - Balance Support: Feet supported Static Sitting - Level of Assistance: 6: Modified independent (Device/Increase time) Dynamic Sitting Balance Dynamic Sitting - Balance Support: Feet supported;During functional activity Dynamic Sitting - Level of Assistance: 5: Stand by assistance (if not having to bend forward) Static Standing Balance Static Standing - Balance Support: During functional activity Static Standing - Level of Assistance: 4: Min assist Dynamic Standing Balance Dynamic Standing - Balance Support: During functional activity Dynamic Standing - Level of Assistance: 4: Min assist Extremity Assessment      RLE Assessment RLE Assessment: Within  Functional Limits LLE Assessment LLE Assessment: Exceptions to [facility] LLE Strength LLE Overall Strength: Due to pain LLE Overall Strength Comments: hip flex 2-/5, hip ext 3+/5, knee ext 2+/5, knee flex 2+/5 (both hip and knee motions painful), ankle PF/DF 4/5  FIM:  FIM - Control and instrumentation engineer Devices: Walker;Arm rests Bed/Chair Transfer: 3: Supine > Sit: Mod A (lifting assist/Pt. 50-74%/lift 2 legs;3: Sit > Supine: Mod A (lifting assist/Pt. 50-74%/lift 2 legs);4: Bed > Chair or W/C: Min A (steadying Pt. > 75%);4: Chair or W/C > Bed: Min A (steadying Pt. > 75%) FIM - Locomotion: Wheelchair Distance: 100 Locomotion: Wheelchair: 2: Travels 50 - 149 ft with supervision, cueing or coaxing FIM - Locomotion: Ambulation Locomotion: Ambulation Assistive Devices: Administrator Ambulation/Gait Assistance: 4: Min assist Locomotion: Ambulation: 1: Travels less than 50 ft with minimal assistance (Pt.>75%) FIM - Locomotion: Stairs Locomotion: Scientist, physiological: Publishing copy: Stairs: 1: Up and Down < 4 stairs with moderate assistance (Pt: 50 - 74%)   Refer to Care Plan for Long Term Goals  Recommendations for other services: None  Discharge Criteria: Patient will be discharged from PT if patient refuses treatment 3 consecutive times without medical reason, if treatment goals not met, if there is a change in medical status, if patient makes no progress towards goals or if patient is discharged from hospital.  The above assessment, treatment plan, treatment alternatives and goals were discussed and mutually agreed upon: by patient  Denice Bors 08/20/2014, 12:23 PM

## 2014-08-20 NOTE — Evaluation (Signed)
Occupational Therapy Assessment and Plan  Patient Details  Name: [name] MRN: [id number] Date of Birth: 1975/09/28  OT Diagnosis: acute pain, muscle weakness (generalized) and pain in joint Rehab Potential: Rehab Potential (ACUTE ONLY): Good ELOS: 7-9 days   Today's Date: 08/20/2014 OT Individual Time: 1101-1156 OT Individual Time Calculation (min): 55 min     Problem List:  Patient Active Problem List   Diagnosis Date Noted  . Vitamin D insufficiency 08/19/2014  . Testosterone deficiency 08/19/2014  . Acetabular fracture 08/19/2014  . Fall from horse 08/13/2014  . Acute blood loss anemia 08/13/2014  . Pelvic fracture 08/12/2014  . HYPERGLYCEMIA, FASTING 06/29/2010    Past Medical History:  Past Medical History  Diagnosis Date  . History of chicken pox   . History of kidney stones    Past Surgical History:  Past Surgical History  Procedure Laterality Date  . Tonsillectomy    . Orif acetabular fracture Left 08/13/2014    Procedure: LEFT OPEN REDUCTION INTERNAL FIXATION (ORIF) ACETABULAR FRACTURE;  Surgeon: Altamese Thaxton, MD;  Location: [facility];  Service: Orthopedics;  Laterality: Left;    Assessment & Plan Clinical Impression: Patient is a 39 y.o. year old male with recent admission to the hospital on 08/11/14 after being thrown off his horse which subsequently landed on top of him. UDS positive for cocaine and THC. Patient was found to have severely comminuted both column acetabulum fracture and was placed in traction by Dr. Marlou Sa. He was evaluated by Dr Marcelino Scot and underwent ORIF left both column acetabular fracture with removal of tibial pin. Post op TDWB LLE X 12 weeks.  Patient transferred to [facility] on 08/19/2014 .    Patient currently requires mod with basic self-care skills secondary to muscle weakness and decreased standing balance, decreased postural control, decreased balance strategies and difficulty maintaining precautions.  Prior to hospitalization, patient could  complete with independent .  Patient will benefit from skilled intervention to decrease level of assist with basic self-care skills and increase independence with basic self-care skills prior to discharge home to his mother's house.  Anticipate patient will require intermittent supervision and no further OT follow recommended.  OT - End of Session Activity Tolerance: Tolerates 30+ min activity with multiple rests Endurance Deficit: Yes OT Assessment Rehab Potential (ACUTE ONLY): Good OT Patient demonstrates impairments in the following area(s): Balance;Pain;Safety OT Basic ADL's Functional Problem(s): Grooming;Bathing;Dressing;Toileting OT Advanced ADL's Functional Problem(s): Simple Meal Preparation OT Transfers Functional Problem(s): Toilet;Tub/Shower OT Additional Impairment(s): None OT Plan OT Intensity: Minimum of 1-2 x/day, 45 to 90 minutes OT Frequency: 5 out of 7 days OT Duration/Estimated Length of Stay: 7-9 days OT Treatment/Interventions: Balance/vestibular training;Self Care/advanced ADL retraining;Patient/family education;Therapeutic Activities;Community reintegration;Discharge planning;Functional mobility training;DME/adaptive equipment instruction;UE/LE Strength taining/ROM;UE/LE Coordination activities OT Self Feeding Anticipated Outcome(s): independent OT Basic Self-Care Anticipated Outcome(s): modified independent OT Toileting Anticipated Outcome(s): modified independent OT Bathroom Transfers Anticipated Outcome(s): supervision to modified independent OT Recommendation Patient destination:  (To his mother's house) Equipment Recommended: 3 in 1 bedside comode   Skilled Therapeutic Intervention Began education on selfcare re-training at the sink sit to stand.  He needed min assist for sit to stand but demonstrates increased pain with decreased flexibility.  Currently he is unable to reach past his mid lower leg for dressing tasks and will likely need use of AE.  Discussed  possible DME needs at discharge and will incorporate both into treatment.    OT Evaluation Precautions/Restrictions  Precautions Precautions: Fall Precaution Comments: has strain in  R shoulder from trapeze use Restrictions Weight Bearing Restrictions: Yes LLE Weight Bearing: Touchdown weight bearing Other Position/Activity Restrictions: Needs continuous cues to maintain WB status  Vital Signs Therapy Vitals Temp: 99 F (37.2 C) Temp Source: Oral Pulse Rate: (!) 103 Resp: 18 BP: 120/67 mmHg Patient Position (if appropriate): Lying Oxygen Therapy SpO2: 96 % O2 Device: Not Delivered Pain Pain Assessment Pain Assessment: 0-10 Pain Score: 2  Pain Location: Hip Pain Orientation: Left Pain Onset: On-going Patients Stated Pain Goal: 1 Pain Intervention(s): Medication (See eMAR);Cold applied Home Living/Prior Functioning Home Living Living Arrangements: Alone Available Help at Discharge: Family Type of Home: House Home Access: Stairs to enter [employer] of Steps: 3 Entrance Stairs-Rails: Right, Left, Can reach both Home Layout: One level Additional Comments: pt lived in an apartment pta. plans to stay with Mom at d/c. He is letting apartment go  Lives With: Family IADL History Current License: Yes Prior Function Level of Independence: Independent with basic ADLs, Independent with gait, Independent with transfers  Able to Take Stairs?: Yes Driving: Yes Vocation: Full time employment Leisure: Hobbies-yes (Comment) Comments: likes to ride horses, hanging out with friends ADL  See FIM scale for details  Vision/Perception  Vision- History Baseline Vision/History: No visual deficits Patient Visual Report: No change from baseline Vision- Assessment Vision Assessment?: No apparent visual deficits  Cognition Overall Cognitive Status: Within Functional Limits for tasks assessed Arousal/Alertness: Awake/alert Orientation Level: Oriented X4 Attention:  Alternating Alternating Attention: Appears intact Memory: Appears intact Awareness: Impaired Awareness Impairment: Anticipatory impairment Behaviors: Impulsive Safety/Judgment: Appears intact Comments: Somewhat impulsive during mobility  Sensation Sensation Light Touch: Appears Intact Stereognosis: Appears Intact Hot/Cold: Appears Intact Proprioception: Appears Intact Coordination Gross Motor Movements are Fluid and Coordinated: Yes Fine Motor Movements are Fluid and Coordinated: Yes Motor  Motor Motor: Within Functional Limits Mobility  Bed Mobility Bed Mobility: Supine to Sit;Sit to Supine Supine to Sit: 3: Mod assist Supine to Sit Details: Verbal cues for sequencing;Verbal cues for technique;Verbal cues for precautions/safety;Manual facilitation for weight shifting;Manual facilitation for placement Sit to Supine: 3: Mod assist Sit to Supine - Details: Verbal cues for sequencing;Verbal cues for technique;Verbal cues for precautions/safety;Manual facilitation for weight shifting;Manual facilitation for placement Transfers Transfers: Sit to Stand Sit to Stand: 4: Min assist;With armrests;With upper extremity assist;From chair/3-in-1 Sit to Stand Details: Manual facilitation for weight shifting;Verbal cues for precautions/safety Stand to Sit: 4: Min assist;With upper extremity assist Stand to Sit Details (indicate cue type and reason): Manual facilitation for weight shifting;Verbal cues for precautions/safety  Trunk/Postural Assessment  Cervical Assessment Cervical Assessment: Within Functional Limits Thoracic Assessment Thoracic Assessment: Within Functional Limits Lumbar Assessment Lumbar Assessment: Within Functional Limits Postural Control Postural Control: Deficits on evaluation Postural Limitations: Note forward flexed posture during all upright mobility, decreased balance due to pain and precautions in LLE.   Balance Balance Balance Assessed: Yes Static Sitting  Balance Static Sitting - Balance Support: Feet supported Static Sitting - Level of Assistance: 6: Modified independent (Device/Increase time) Dynamic Sitting Balance Dynamic Sitting - Balance Support: Feet supported;During functional activity Dynamic Sitting - Level of Assistance: 5: Stand by assistance Static Standing Balance Static Standing - Balance Support: During functional activity Static Standing - Level of Assistance: 4: Min assist Dynamic Standing Balance Dynamic Standing - Balance Support: During functional activity Dynamic Standing - Level of Assistance: 4: Min assist Extremity/Trunk Assessment RUE Assessment RUE Assessment: Exceptions to [facility] RUE Strength RUE Overall Strength Comments: AROM WFLS for all joints, shoulder flexion 4-/5 as well  as external rotation, pt reporting "pulled muscle" in his shoulder from using the trapeze on acute.  Elbow flexion/extension as well as gross grasp wthin functional limist.  LUE Assessment LUE Assessment: Within Functional Limits  FIM:  FIM - Eating Eating Activity: 7: Complete independence:no helper FIM - Grooming Grooming Steps: Wash, rinse, dry face;Wash, rinse, dry hands;Oral care, brush teeth, clean dentures Grooming: 5: Supervision: safety issues or verbal cues FIM - Bathing Bathing Steps Patient Completed: Chest;Right Arm;Left Arm;Abdomen;Front perineal area;Buttocks;Right upper leg;Left upper leg Bathing: 4: Min-Patient completes 8-9 67f10 parts or 75+ percent FIM - Upper Body Dressing/Undressing Upper body dressing/undressing steps patient completed: Put head through opening of pull over shirt/dress;Thread/unthread left sleeve of pullover shirt/dress;Thread/unthread right sleeve of pullover shirt/dresss;Pull shirt over trunk Upper body dressing/undressing: 5: Supervision: Safety issues/verbal cues FIM - Lower Body Dressing/Undressing Lower body dressing/undressing: 1: Total-Patient completed less than 25% of tasks   Refer to  Care Plan for Long Term Goals  Recommendations for other services: None  Discharge Criteria: Patient will be discharged from OT if patient refuses treatment 3 consecutive times without medical reason, if treatment goals not met, if there is a change in medical status, if patient makes no progress towards goals or if patient is discharged from hospital.  The above assessment, treatment plan, treatment alternatives and goals were discussed and mutually agreed upon: by patient  Melenda Bielak OTR/L 08/20/2014, 4:32 PM

## 2014-08-20 NOTE — Progress Notes (Signed)
39 y.o. male who was admitted on 08/11/14 after being thrown off his horse which subsequently landed on top of him. UDS positive for cocaine and THC. Patient was found to have severely comminuted both column acetabulum fracture and was placed in traction by Dr. August Saucerean. He was evaluated by Dr [name] and underwent ORIF left both column acetabular fracture with removal of tibial pin. Post op TDWB LLE X 12 weeks with recommendations for lovenox X 20 days followed by ASA bid. ABLA treated with 2 units PRBC. Patient has had issues with scrotal pain as well as abdominal discomfort due to ileus and was started on bowel program without results? He had had recent tooth abscess treated with clindamycin. Low Vitamin D levels likely due to cannabis use and supplementation recommended by ortho  Subjective/Complaints:  Had BM yesterday, no abd pain no nausea or vomiting, poor appetite, doesn't like hospital food Objective: Vital Signs: Blood pressure 111/65, pulse 87, temperature 98.3 F (36.8 C), temperature source Oral, resp. rate 18, SpO2 96 %. Dg Abd 1 View  08/19/2014   CLINICAL DATA:  Abdominal pain and distention 6 days after hip surgery.  EXAM: ABDOMEN - 1 VIEW  COMPARISON:  08/11/2014 abdominal CT  FINDINGS: Colonic distention with fluid levels. No visible stool. No dilated small bowel. Partial imaging of the upper abdomen due to patient condition; no evidence of pneumoperitoneum.  Changes of recent left pelvic fixation.  IMPRESSION: Colonic distention with fluid levels which could reflect a diarrheal illness or ileus.   Electronically Signed   By: Marnee SpringJonathon  Watts M.D.   On: 08/19/2014 21:28   Results for orders placed or performed during the hospital encounter of 08/11/14 (from the past 72 hour(s))  CBC     Status: Abnormal   Collection Time: 08/18/14  2:35 PM  Result Value Ref Range   WBC 6.2 4.0 - 10.5 K/uL   RBC 2.90 (L) 4.22 - 5.81 MIL/uL   Hemoglobin 8.2 (L) 13.0 - 17.0 g/dL   HCT 91.425.0 (L) 78.239.0  - 52.0 %   MCV 86.2 78.0 - 100.0 fL   MCH 28.3 26.0 - 34.0 pg   MCHC 32.8 30.0 - 36.0 g/dL   RDW 95.614.8 21.311.5 - 08.615.5 %   Platelets 294 150 - 400 K/uL     HEENT: normal Cardio: RRR and no murmur Resp: CTA B/L and unlabored GI: BS positive and NT ND Extremity:  Edema Left thigh, inguinal, scrotal Skin:   Bruise left inguinal and left gluteal, suprapubic incision, Left inguinal incision Neuro: Alert/Oriented, Normal Sensory and Abnormal Motor trace left hip flexio and knee ext (pain limited), 3/5 L ADF/PF, 5/5 in BUE and RLE Musc/Skel:  Swelling Left thigh Gen NAD   Assessment/Plan: 1. Functional deficits secondary to Left acetabular fracture TDWB x 8wks which require 3+ hours per day of interdisciplinary therapy in a comprehensive inpatient rehab setting. Physiatrist is providing close team supervision and 24 hour management of active medical problems listed below. Physiatrist and rehab team continue to assess barriers to discharge/monitor patient progress toward functional and medical goals. FIM:                   Comprehension Comprehension Mode: Auditory Comprehension: 7-Follows complex conversation/direction: With no assist  Expression Expression Mode: Verbal Expression: 7-Expresses complex ideas: With no assist  Social Interaction Social Interaction: 7-Interacts appropriately with others - No medications needed.  Problem Solving Problem Solving: 6-Solves complex problems: With extra time  Memory Memory: 6-More than reasonable amt of  time  Medical Problem List and Plan: 1. Functional deficits secondary to Left acetabular fracture--TTWB X 8 weeks.  2. DVT Prophylaxis/Anticoagulation: Pharmaceutical: Lovenox D# 6/ 21 followed by ASA 325 mg bid.  3. Pain Management: Discontinue IV dilaudid as MS contin increased to tid today. Continue oxycodone 20 mg prn.  4. Mood: LCSW to follow for evaluation and support.  5. Neuropsych: This patient is capable of making  decisions on his own behalf. 6. Skin/Wound Care: routine pressure relief measures.  7. Fluids/Electrolytes/Nutrition: Monitor I/O. Offer supplements as needed. Check lytes in am. 8. ABLA: Will continue to monitor. Recheck CBC in am.  9. Scrotal trauma/edema: Local treatment with elevation and ice--limited to 20 minutes as a time. .  10. Polysubstance abuse: Patient doesn't perceive any problems with his substance use 11. RUE strain: Due to continued trapeze bar use. Will d/c and educate on alternative methods.  12. Rash upper back: Likely heat rash. Continue microguard powder for antifungal coverage and will add steroid cream to help with itching.   LOS (Days) 1 A FACE TO FACE EVALUATION WAS PERFORMED  Aubreanna Percle E 08/20/2014, 7:15 AM

## 2014-08-21 ENCOUNTER — Inpatient Hospital Stay (HOSPITAL_COMMUNITY): Payer: PRIVATE HEALTH INSURANCE | Admitting: Occupational Therapy

## 2014-08-21 ENCOUNTER — Inpatient Hospital Stay (HOSPITAL_COMMUNITY): Payer: PRIVATE HEALTH INSURANCE | Admitting: Rehabilitation

## 2014-08-21 ENCOUNTER — Inpatient Hospital Stay (HOSPITAL_COMMUNITY): Payer: BLUE CROSS/BLUE SHIELD

## 2014-08-21 DIAGNOSIS — K913 Postprocedural intestinal obstruction: Secondary | ICD-10-CM

## 2014-08-21 LAB — BASIC METABOLIC PANEL
ANION GAP: 9 (ref 5–15)
BUN: 12 mg/dL (ref 6–20)
CHLORIDE: 95 mmol/L — AB (ref 101–111)
CO2: 27 mmol/L (ref 22–32)
Calcium: 8.8 mg/dL — ABNORMAL LOW (ref 8.9–10.3)
Creatinine, Ser: 0.82 mg/dL (ref 0.61–1.24)
Glucose, Bld: 113 mg/dL — ABNORMAL HIGH (ref 70–99)
POTASSIUM: 4 mmol/L (ref 3.5–5.1)
Sodium: 131 mmol/L — ABNORMAL LOW (ref 135–145)

## 2014-08-21 MED ORDER — OXYCODONE HCL 5 MG PO TABS
15.0000 mg | ORAL_TABLET | ORAL | Status: DC | PRN
  Administered 2014-08-21 – 2014-08-28 (×34): 15 mg via ORAL
  Filled 2014-08-21 (×35): qty 3

## 2014-08-21 MED ORDER — POLYETHYLENE GLYCOL 3350 17 G PO PACK
17.0000 g | PACK | Freq: Two times a day (BID) | ORAL | Status: DC
  Administered 2014-08-21 – 2014-08-23 (×5): 17 g via ORAL
  Filled 2014-08-21 (×8): qty 1

## 2014-08-21 MED ORDER — METOCLOPRAMIDE HCL 10 MG PO TABS
10.0000 mg | ORAL_TABLET | Freq: Three times a day (TID) | ORAL | Status: DC
  Administered 2014-08-21 – 2014-08-23 (×7): 10 mg via ORAL
  Filled 2014-08-21 (×9): qty 1

## 2014-08-21 NOTE — Progress Notes (Signed)
39 y.o. male who was admitted on 08/11/14 after being thrown off his horse which subsequently landed on top of him. UDS positive for cocaine and THC. Patient was found to have severely comminuted both column acetabulum fracture and was placed in traction by Dr. Marlou Sa. He was evaluated by Dr Marcelino Scot and underwent ORIF left both column acetabular fracture with removal of tibial pin. Post op TDWB LLE X 12 weeks with recommendations for lovenox X 20 days followed by ASA bid. ABLA treated with 2 units PRBC. Patient has had issues with scrotal pain as well as abdominal discomfort due to ileus and was started on bowel program without results? He had had recent tooth abscess treated with clindamycin. Low Vitamin D levels likely due to cannabis use and supplementation recommended by ortho  Subjective/Complaints: Reviewed Xrays, pt still with poor appetite, had soft BM yesterday No abd pain , nausea or vomiting  Objective: Vital Signs: Blood pressure 101/61, pulse 87, temperature 98.7 F (37.1 C), temperature source Oral, resp. rate 18, weight 97.5 kg (214 lb 15.2 oz), SpO2 95 %. Dg Abd 1 View  08/21/2014   CLINICAL DATA:  Abdominal pain and distention.  EXAM: ABDOMEN - 1 VIEW  COMPARISON:  08/19/2014  FINDINGS: There is air throughout small and large bowel. There are a few short air-fluid levels in the right hemicolon which likely reflects liquid stool. There is no evidence of bowel obstruction or perforation.  IMPRESSION: Negative for obstruction or perforation. Colonic air-fluid levels likely reflect liquid stool.   Electronically Signed   By: Andreas Newport M.D.   On: 08/21/2014 06:54   Dg Abd 1 View  08/19/2014   CLINICAL DATA:  Abdominal pain and distention 6 days after hip surgery.  EXAM: ABDOMEN - 1 VIEW  COMPARISON:  08/11/2014 abdominal CT  FINDINGS: Colonic distention with fluid levels. No visible stool. No dilated small bowel. Partial imaging of the upper abdomen due to patient condition; no  evidence of pneumoperitoneum.  Changes of recent left pelvic fixation.  IMPRESSION: Colonic distention with fluid levels which could reflect a diarrheal illness or ileus.   Electronically Signed   By: Monte Fantasia M.D.   On: 08/19/2014 21:28   Results for orders placed or performed during the hospital encounter of 08/19/14 (from the past 72 hour(s))  CBC WITH DIFFERENTIAL     Status: Abnormal   Collection Time: 08/20/14  7:50 AM  Result Value Ref Range   WBC 6.6 4.0 - 10.5 K/uL   RBC 2.96 (L) 4.22 - 5.81 MIL/uL   Hemoglobin 8.1 (L) 13.0 - 17.0 g/dL   HCT 25.4 (L) 39.0 - 52.0 %   MCV 85.8 78.0 - 100.0 fL   MCH 27.4 26.0 - 34.0 pg   MCHC 31.9 30.0 - 36.0 g/dL   RDW 14.9 11.5 - 15.5 %   Platelets 294 150 - 400 K/uL   Neutrophils Relative % 71 43 - 77 %   Neutro Abs 4.7 1.7 - 7.7 K/uL   Lymphocytes Relative 17 12 - 46 %   Lymphs Abs 1.1 0.7 - 4.0 K/uL   Monocytes Relative 9 3 - 12 %   Monocytes Absolute 0.6 0.1 - 1.0 K/uL   Eosinophils Relative 3 0 - 5 %   Eosinophils Absolute 0.2 0.0 - 0.7 K/uL   Basophils Relative 0 0 - 1 %   Basophils Absolute 0.0 0.0 - 0.1 K/uL  Comprehensive metabolic panel     Status: Abnormal   Collection Time: 08/20/14  7:50 AM  Result Value Ref Range   Sodium 132 (L) 135 - 145 mmol/L   Potassium 4.0 3.5 - 5.1 mmol/L   Chloride 96 (L) 101 - 111 mmol/L   CO2 26 22 - 32 mmol/L   Glucose, Bld 110 (H) 70 - 99 mg/dL   BUN 11 6 - 20 mg/dL   Creatinine, Ser 0.83 0.61 - 1.24 mg/dL   Calcium 8.7 (L) 8.9 - 10.3 mg/dL   Total Protein 5.9 (L) 6.5 - 8.1 g/dL   Albumin 2.8 (L) 3.5 - 5.0 g/dL   AST 33 15 - 41 U/L   ALT 50 17 - 63 U/L   Alkaline Phosphatase 71 38 - 126 U/L   Total Bilirubin 1.6 (H) 0.3 - 1.2 mg/dL   GFR calc non Af Amer >60 >60 mL/min   GFR calc Af Amer >60 >60 mL/min    Comment: (NOTE) The eGFR has been calculated using the CKD EPI equation. This calculation has not been validated in all clinical situations. eGFR's persistently <90 mL/min  signify possible Chronic Kidney Disease.    Anion gap 10 5 - 15     HEENT: normal Cardio: RRR and no murmur Resp: CTA B/L and unlabored GI: BS positive and NT ND Extremity:  Edema Left thigh, inguinal, Skin:   Bruise left inguinal and left gluteal, suprapubic incision, Left inguinal incision Neuro: Alert/Oriented, Normal Sensory and Abnormal Motor trace left hip flexio and knee ext (pain limited), 3/5 L ADF/PF, 5/5 in BUE and RLE Musc/Skel:  Swelling Left thigh Gen NAD   Assessment/Plan: 1. Functional deficits secondary to Left acetabular fracture TDWB x 8wks which require 3+ hours per day of interdisciplinary therapy in a comprehensive inpatient rehab setting. Physiatrist is providing close team supervision and 24 hour management of active medical problems listed below. Physiatrist and rehab team continue to assess barriers to discharge/monitor patient progress toward functional and medical goals. Team conference today please see physician documentation under team conference tab, met with team face-to-face to discuss problems,progress, and goals. Formulized individual treatment plan based on medical history, underlying problem and comorbidities. FIM: FIM - Bathing Bathing Steps Patient Completed: Chest, Right Arm, Left Arm, Abdomen, Front perineal area, Buttocks, Right upper leg, Left upper leg Bathing: 4: Min-Patient completes 8-9 61f 10 parts or 75+ percent  FIM - Upper Body Dressing/Undressing Upper body dressing/undressing steps patient completed: Put head through opening of pull over shirt/dress, Thread/unthread left sleeve of pullover shirt/dress, Thread/unthread right sleeve of pullover shirt/dresss, Pull shirt over trunk Upper body dressing/undressing: 5: Supervision: Safety issues/verbal cues FIM - Lower Body Dressing/Undressing Lower body dressing/undressing: 1: Total-Patient completed less than 25% of tasks        FIM - Control and instrumentation engineer  Devices: Walker, Arm rests Bed/Chair Transfer: 3: Supine > Sit: Mod A (lifting assist/Pt. 50-74%/lift 2 legs, 3: Sit > Supine: Mod A (lifting assist/Pt. 50-74%/lift 2 legs), 4: Bed > Chair or W/C: Min A (steadying Pt. > 75%), 4: Chair or W/C > Bed: Min A (steadying Pt. > 75%)  FIM - Locomotion: Wheelchair Distance: 100 Locomotion: Wheelchair: 2: Travels 50 - 149 ft with supervision, cueing or coaxing FIM - Locomotion: Ambulation Locomotion: Ambulation Assistive Devices: Administrator Ambulation/Gait Assistance: 4: Min assist Locomotion: Ambulation: 1: Travels less than 50 ft with minimal assistance (Pt.>75%)  Comprehension Comprehension Mode: Auditory Comprehension: 7-Follows complex conversation/direction: With no assist  Expression Expression Mode: Verbal Expression: 7-Expresses complex ideas: With no assist  Social Interaction Social Interaction:  7-Interacts appropriately with others - No medications needed.  Problem Solving Problem Solving: 6-Solves complex problems: With extra time  Memory Memory: 6-More than reasonable amt of time  Medical Problem List and Plan: 1. Functional deficits secondary to Left acetabular fracture--TTWB X 8 weeks.  2. DVT Prophylaxis/Anticoagulation: Pharmaceutical: Lovenox D# 7/ 21 followed by ASA 325 mg bid.  3. Pain Management: Discontinue IV dilaudid as MS contin increased to tid today. Continue oxycodone 20 mg prn.  4. Mood: LCSW to follow for evaluation and support.  5. Neuropsych: This patient is capable of making decisions on his own behalf. 6. Skin/Wound Care: routine pressure relief measures.  7. Fluids/Electrolytes/Nutrition: Monitor I/O. Offer supplements as needed. Check lytes in am. 8. ABLA: Will continue to monitor. Recheck CBC in am.  9. Scrotal trauma/edema: Local treatment with elevation and ice--limited to 20 minutes as a time. .  10. Polysubstance abuse: Patient doesn't perceive any problems with his substance  use 11. RUE strain: Due to continued trapeze bar use. Will d/c and educate on alternative methods.  12. Rash upper back: Likely heat rash. Continue microguard powder for antifungal coverage and will add steroid cream to help with itching.   LOS (Days) 2 A FACE TO FACE EVALUATION WAS PERFORMED  KIRSTEINS,ANDREW E 08/21/2014, 7:23 AM

## 2014-08-21 NOTE — Progress Notes (Signed)
Physical Therapy Session Note  Patient Details  Name: [name] MRN: [id number] Date of Birth: Oct 27, 1975  Today's Date: 08/21/2014 PT Individual Time: 0830-0930 PT Individual Time Calculation (min): 60 min   Short Term Goals: Week 1:  PT Short Term Goal 1 (Week 1): =LTG's due to ELOS  Skilled Therapeutic Interventions/Progress Updates:   Pt received sitting in recliner in room, agreeable to therapy session.  Joined by RT for recreational evaluation.  Skilled session focused on w/c propulsion to/from therapy gym x 100' x 2 reps at S level for overall general strengthening and endurance, gait training with use of RW for quality and distance improvement, stair negotiation with use of RW to simulate home entry, therex for LLE ROM/strength, and bed mobility to increase independence in home bed.  Performed gait x 20', and another 15' x 1 with use of RW with and without WB sensor on LLE to determine if pt following TDWB precautions.  Cues and demonstration for using latissimus muscles for more upright posture rather than keeping forward flexed posture.  Performed 2, 6" steps with use of RW with +2A (to stabilize RW) with cues for stepping sequence and safety.  Progressed to performing seated nustep with use of BLE/UEs with cues for only using UEs to increased ROM on LLE to prevent WB through LLE.  Tolerated 3 mins at level 1 resistance as above with slow steady improvement in ROM.  Ended session with bed mobility in ADL apt with use of leg lifter at min A into bed and mod A to elevate trunk to EOB due to pain in hip.  Transferred back to w/c and propelled back to room.  All needs in reach.    Therapy Documentation Precautions:  Precautions Precautions: Fall Precaution Comments: has strain in R shoulder from trapeze use Restrictions Weight Bearing Restrictions: Yes LLE Weight Bearing: Touchdown weight bearing Other Position/Activity Restrictions: Needs continuous cues to maintain WB status    Pain: Pt with 6/10 pain during gait in L hip and groin, better with rest and ice at end of session.  meds given prior to session.    Locomotion : Ambulation Ambulation/Gait Assistance: 4: Min Lawyerassist Wheelchair Mobility Distance: 100   See FIM for current functional status  Therapy/Group: Individual Therapy and co-treat with RT  Vista Deckarcell, Akeiba Axelson Ann 08/21/2014, 11:13 AM

## 2014-08-21 NOTE — Progress Notes (Signed)
Occupational Therapy Session Note  Patient Details  Name: Kevin RudSteven L Nagy MRN: [id number] Date of Birth: Nov 17, 1975  Today's Date: 08/21/2014 OT Individual Time: 1300-1415 OT Individual Time Calculation (min): 75 min    Short Term Goals: Week 1:  OT Short Term Goal 1 (Week 1): STGs equal to LTGs based on ELOS.  Skilled Therapeutic Interventions/Progress Updates:  Upon entering the room, pt supine in bed with 6/10 reported pain in L LE. Pt reporting current precautions with 100% accuracy. Pt performed supine> sit with Mod A and use of leg lifter for L LE. Min A for transfer into wheelchair from bed. Pt declined shower this session.  Pt propelled self in wheelchair to bathroom for min A transfer onto standard height toilet with use of grab bars. Pt unable to void this session. Pt propelled self out of room and to ADL apartment. Pt declined practicing shower transfer but therapist demonstrated transfer, recommendation for safety treads, 1 non slip bath mat, and removal of his current mat in side of tub. Pt propelled wheelchair day room ~ 150 feet + with increased time and 1 rest break from fatigue. Pt standing for 5 minutes while engaging in table top pipe tree activity. Pt engaging in 1 hand supported to no UE support with task and min A for dynamic standing balance. Pt sitting secondary to fatigue and pain. Pt reporting, "I can't stand anymore." Pt assisted back to room via total A secondary to fatigue. Min A stand pivot transfer wheelchair >bed . Sit >supine with min A for R LE and use of leg lifter for L LE. OT educated pt on pain management and discussed current scheduled and prn medications for pain relief. Pt verbalizing understanding with education to continue. Call bell and all needed items within reach upon exiting the room.   Therapy Documentation Precautions:  Precautions Precautions: Fall Precaution Comments: has strain in R shoulder from trapeze use Restrictions Weight Bearing  Restrictions: Yes LLE Weight Bearing: Touchdown weight bearing Other Position/Activity Restrictions: Needs continuous cues to maintain WB status Vital Signs: Therapy Vitals Temp: 97 F (36.1 C) Temp Source: Oral Pulse Rate: (!) 102 Resp: 18 BP: 115/68 mmHg Patient Position (if appropriate): Lying Oxygen Therapy SpO2: 97 % O2 Device: Not Delivered  See FIM for current functional status  Therapy/Group: Individual Therapy  Lowella Gripittman, Mortimer Bair L 08/21/2014, 4:27 PM

## 2014-08-21 NOTE — Progress Notes (Signed)
Physical Therapy Session Note  Patient Details  Name: [name] Esther MRN: [id number] Date of Birth: Apr 24, 1975  Today's Date: 08/21/2014 PT Individual Time: 1500-1600 PT Individual Time Calculation (min): 60 min   Short Term Goals: Week 1:  PT Short Term Goal 1 (Week 1): =LTG's due to ELOS  Skilled Therapeutic Interventions/Progress Updates:   Skilled session focused on bed mobility with use of leg lifter, getting in/out of bed from lower inclines, bed level therex for strengthening and ROM and assessment and trial of getting into R SL for improved sleep.  Pt able to transfer w/c<>mat with RW at S level with min cues for w/c set and and removal of parts.  Pt able to get into supine at S level with use of leg lifter.  Requires increased time to adjust to lying flat due to tightness and pain in [name] hip.  Assisted with pillows and adding bolster under [name] knee.  Performed SAQ's x 10 reps LLE, SLR x 10 reps RLE, knee flex w/ use of leg lifter for active assist x 10 reps BLE.  Added wedge under back in order to work on pt getting up into long sitting from decreased height (compared to elevating bed in room).  Pt able to perform at light min A level.  Will continue to work on decreasing incline of bed.  Pt transferred back to w/c and propelled to room.  Left in recliner with all needs in reach.    Therapy Documentation Precautions:  Precautions Precautions: Fall Precaution Comments: has strain in R shoulder from trapeze use Restrictions Weight Bearing Restrictions: Yes LLE Weight Bearing: Touchdown weight bearing Other Position/Activity Restrictions: Needs continuous cues to maintain WB status   Vital Signs: Therapy Vitals Temp: 97 F (36.1 C) Temp Source: Oral Pulse Rate: (!) 102 Resp: 18 BP: 115/68 mmHg Patient Position (if appropriate): Lying Oxygen Therapy SpO2: 97 % O2 Device: Not Delivered Pain: Pt with continued pain in [name] hip and groin, better with repositioning.   See FIM for  current functional status  Therapy/Group: Individual Therapy  Vista Deckarcell, Malek Skog Ann 08/21/2014, 4:30 PM

## 2014-08-22 ENCOUNTER — Inpatient Hospital Stay (HOSPITAL_COMMUNITY): Payer: PRIVATE HEALTH INSURANCE | Admitting: Occupational Therapy

## 2014-08-22 ENCOUNTER — Inpatient Hospital Stay (HOSPITAL_COMMUNITY): Payer: PRIVATE HEALTH INSURANCE | Admitting: Rehabilitation

## 2014-08-22 MED ORDER — ASPIRIN EC 325 MG PO TBEC: 325.0000 mg | DELAYED_RELEASE_TABLET | Freq: Two times a day (BID) | ORAL | Status: DC

## 2014-08-22 NOTE — Progress Notes (Signed)
Physical Therapy Session Note  Patient Details  Name: [name] MRN: [id number] Date of Birth: [birth year]  Today's Date: 08/22/2014 PT Individual Time: 1030-1100 and 1500-1600 PT Individual Time Calculation (min): 30 min and 60 mins   Short Term Goals: Week 1:  PT Short Term Goal 1 (Week 1): =LTG's due to ELOS  Skilled Therapeutic Interventions/Progress Updates:   AM session: Skilled session focused on bed mobility from hospital bed with HOB at 25 deg and without handrails to increase independence upon D/C home, w/c propulsion to/from therapy gym for overall strengthening and activity tolerance and toilet transfer with short distance gait in/out of restroom and standing tolerance while washing/drying hands.  Pt able to perform w/c propulsion at S level with min cues for safety.  Performed toileting tasks at min to mod A level for transfer and need to assist with pulling pants up following toileting.  Stood at sink with RW at S level with cues to abide WB precautions.  Pt left in w/c with all needs in reach.  Ice packs applied for pain.    PM session:  Skilled session focused on bed mobility from hospital bed and also from ADL apt bed to better simulate home and increase independence with task, supine therex and gait training with RW for improved quality and distance.  Performed bed mobility during session at S level today with use of leg lifter.  Cues for sequencing and technique as well as safety with use of leg lifter.  Performed all transfers at S level with RW.  Education on w/c for community use with pt stating that he feels he can follow w/c and RW from friends.  Performed supine therex while inclined on wedge as follows; SAQ x 10 reps LLE, SLR x 10 reps RLE, active assisted L heel slides x 10 reps, glute and quad sets x 10 reps BLE.  Ended with gait x 20' with RW at min/guard to close S level with continued cues on sequencing, posture, maintaining TDWB and increased latissimus muscle use.  Pt  tolerated well with increased pain, RN provided pain meds.  Left in room with all needs in reach, ice packs donned for pain control.    Therapy Documentation Precautions:  Precautions Precautions: Fall Precaution Comments: has strain in R shoulder from trapeze use Restrictions Weight Bearing Restrictions: Yes LLE Weight Bearing: Touchdown weight bearing Other Position/Activity Restrictions: Needs continuous cues to maintain WB status   Vital Signs: Therapy Vitals Temp: 98.7 F (37.1 C) Temp Source: Oral Pulse Rate: (!) 105 Resp: 20 BP: 96/63 mmHg Patient Position (if appropriate): Sitting Oxygen Therapy SpO2: 95 % O2 Device: Not Delivered Pain: Pain Assessment Pain Assessment: 0-10 Pain Score: 3    Locomotion : Ambulation Ambulation/Gait Assistance: 4: Min assist Wheelchair Mobility Distance: 150   See FIM for current functional status  Therapy/Group: Individual Therapy  Vista Deckarcell, [name] 08/22/2014, 5:30 PM

## 2014-08-22 NOTE — Progress Notes (Signed)
38 y.o. male who was admitted on 08/11/14 after being thrown off his horse which subsequently landed on top of him. UDS positive for cocaine and THC. Patient was found to have severely comminuted both column acetabulum fracture and was placed in traction by Dr. Marlou Sa. He was evaluated by Dr Marcelino Scot and underwent ORIF left both column acetabular fracture with removal of tibial pin. Post op TDWB LLE X 12 weeks with recommendations for lovenox X 20 days followed by ASA bid. ABLA treated with 2 units PRBC. Patient has had issues with scrotal pain as well as abdominal discomfort due to ileus and was started on bowel program without results? He had had recent tooth abscess treated with clindamycin. Subjective/Complaints:  Pt feels ready to eat solids   Objective: Vital Signs: Blood pressure 119/69, pulse 96, temperature 99.2 F (37.3 C), temperature source Oral, resp. rate 18, weight 97.5 kg (214 lb 15.2 oz), SpO2 98 %. Dg Abd 1 View  08/21/2014   CLINICAL DATA:  Abdominal pain and distention.  EXAM: ABDOMEN - 1 VIEW  COMPARISON:  08/19/2014  FINDINGS: There is air throughout small and large bowel. There are a few short air-fluid levels in the right hemicolon which likely reflects liquid stool. There is no evidence of bowel obstruction or perforation.  IMPRESSION: Negative for obstruction or perforation. Colonic air-fluid levels likely reflect liquid stool.   Electronically Signed   By: Andreas Newport M.D.   On: 08/21/2014 06:54   Results for orders placed or performed during the hospital encounter of 08/19/14 (from the past 72 hour(s))  CBC WITH DIFFERENTIAL     Status: Abnormal   Collection Time: 08/20/14  7:50 AM  Result Value Ref Range   WBC 6.6 4.0 - 10.5 K/uL   RBC 2.96 (L) 4.22 - 5.81 MIL/uL   Hemoglobin 8.1 (L) 13.0 - 17.0 g/dL   HCT 25.4 (L) 39.0 - 52.0 %   MCV 85.8 78.0 - 100.0 fL   MCH 27.4 26.0 - 34.0 pg   MCHC 31.9 30.0 - 36.0 g/dL   RDW 14.9 11.5 - 15.5 %   Platelets 294 150 -  400 K/uL   Neutrophils Relative % 71 43 - 77 %   Neutro Abs 4.7 1.7 - 7.7 K/uL   Lymphocytes Relative 17 12 - 46 %   Lymphs Abs 1.1 0.7 - 4.0 K/uL   Monocytes Relative 9 3 - 12 %   Monocytes Absolute 0.6 0.1 - 1.0 K/uL   Eosinophils Relative 3 0 - 5 %   Eosinophils Absolute 0.2 0.0 - 0.7 K/uL   Basophils Relative 0 0 - 1 %   Basophils Absolute 0.0 0.0 - 0.1 K/uL  Comprehensive metabolic panel     Status: Abnormal   Collection Time: 08/20/14  7:50 AM  Result Value Ref Range   Sodium 132 (L) 135 - 145 mmol/L   Potassium 4.0 3.5 - 5.1 mmol/L   Chloride 96 (L) 101 - 111 mmol/L   CO2 26 22 - 32 mmol/L   Glucose, Bld 110 (H) 70 - 99 mg/dL   BUN 11 6 - 20 mg/dL   Creatinine, Ser 0.83 0.61 - 1.24 mg/dL   Calcium 8.7 (L) 8.9 - 10.3 mg/dL   Total Protein 5.9 (L) 6.5 - 8.1 g/dL   Albumin 2.8 (L) 3.5 - 5.0 g/dL   AST 33 15 - 41 U/L   ALT 50 17 - 63 U/L   Alkaline Phosphatase 71 38 - 126 U/L   Total Bilirubin  1.6 (H) 0.3 - 1.2 mg/dL   GFR calc non Af Amer >60 >60 mL/min   GFR calc Af Amer >60 >60 mL/min    Comment: (NOTE) The eGFR has been calculated using the CKD EPI equation. This calculation has not been validated in all clinical situations. eGFR's persistently <90 mL/min signify possible Chronic Kidney Disease.    Anion gap 10 5 - 15  Basic metabolic panel     Status: Abnormal   Collection Time: 08/21/14  8:10 AM  Result Value Ref Range   Sodium 131 (L) 135 - 145 mmol/L   Potassium 4.0 3.5 - 5.1 mmol/L   Chloride 95 (L) 101 - 111 mmol/L   CO2 27 22 - 32 mmol/L   Glucose, Bld 113 (H) 70 - 99 mg/dL   BUN 12 6 - 20 mg/dL   Creatinine, Ser 0.82 0.61 - 1.24 mg/dL   Calcium 8.8 (L) 8.9 - 10.3 mg/dL   GFR calc non Af Amer >60 >60 mL/min   GFR calc Af Amer >60 >60 mL/min    Comment: (NOTE) The eGFR has been calculated using the CKD EPI equation. This calculation has not been validated in all clinical situations. eGFR's persistently <90 mL/min signify possible Chronic  Kidney Disease.    Anion gap 9 5 - 15     HEENT: normal Cardio: RRR and no murmur Resp: CTA B/L and unlabored GI: BS positive and NT ND Extremity:  Edema Left thigh, inguinal, Skin:   Bruise left inguinal and left gluteal, suprapubic incision, Left inguinal incision Neuro: Alert/Oriented, Normal Sensory and Abnormal Motor trace left hip flexio and knee ext (pain limited), 3/5 L ADF/PF, 5/5 in BUE and RLE Musc/Skel:  Swelling Left thigh Gen NAD   Assessment/Plan: 1. Functional deficits secondary to Left acetabular fracture TDWB x 8wks which require 3+ hours per day of interdisciplinary therapy in a comprehensive inpatient rehab setting. Physiatrist is providing close team supervision and 24 hour management of active medical problems listed below. Physiatrist and rehab team continue to assess barriers to discharge/monitor patient progress toward functional and medical goals.  FIM: FIM - Bathing Bathing Steps Patient Completed: Chest, Right Arm, Left Arm, Abdomen, Front perineal area, Buttocks, Right upper leg, Left upper leg Bathing: 4: Min-Patient completes 8-9 35f 10 parts or 75+ percent  FIM - Upper Body Dressing/Undressing Upper body dressing/undressing steps patient completed: Put head through opening of pull over shirt/dress, Thread/unthread left sleeve of pullover shirt/dress, Thread/unthread right sleeve of pullover shirt/dresss, Pull shirt over trunk Upper body dressing/undressing: 5: Supervision: Safety issues/verbal cues FIM - Lower Body Dressing/Undressing Lower body dressing/undressing: 1: Total-Patient completed less than 25% of tasks     FIM - Radio producer Devices: Grab bars Toilet Transfers: 4-To toilet/BSC: Min A (steadying Pt. > 75%), 4-From toilet/BSC: Min A (steadying Pt. > 75%)  FIM - Bed/Chair Transfer Bed/Chair Transfer Assistive Devices: Walker, Arm rests Bed/Chair Transfer: 3: Supine > Sit: Mod A (lifting assist/Pt.  50-74%/lift 2 legs, 4: Sit > Supine: Min A (steadying pt. > 75%/lift 1 leg), 4: Chair or W/C > Bed: Min A (steadying Pt. > 75%), 4: Bed > Chair or W/C: Min A (steadying Pt. > 75%)  FIM - Locomotion: Wheelchair Distance: 100 Locomotion: Wheelchair: 2: Travels 50 - 149 ft with supervision, cueing or coaxing FIM - Locomotion: Ambulation Locomotion: Ambulation Assistive Devices: Administrator Ambulation/Gait Assistance: 4: Min assist Locomotion: Ambulation: 1: Travels less than 50 ft with minimal assistance (Pt.>75%)  Comprehension Comprehension  Mode: Auditory Comprehension: 7-Follows complex conversation/direction: With no assist  Expression Expression Mode: Verbal Expression: 7-Expresses complex ideas: With no assist  Social Interaction Social Interaction: 7-Interacts appropriately with others - No medications needed.  Problem Solving Problem Solving: 6-Solves complex problems: With extra time  Memory Memory: 6-More than reasonable amt of time  Medical Problem List and Plan: 1. Functional deficits secondary to Left acetabular fracture--TTWB X 8 weeks.  2. DVT Prophylaxis/Anticoagulation: Pharmaceutical: Lovenox D# 8/ 21 followed by ASA 325 mg bid.  3. Pain Management: Discontinue IV dilaudid as MS contin increased to tid today. Continue oxycodone 15 mg prn.  4. Mood: LCSW to follow for evaluation and support.  5. Neuropsych: This patient is capable of making decisions on his own behalf. 6. Skin/Wound Care: routine pressure relief measures.  7. Fluids/Electrolytes/Nutrition: Monitor I/O. Offer supplements as needed. Check lytes in am. 8. ABLA: Will continue to monitor. Recheck CBC in am.  9. Scrotal trauma/edema: Local treatment with elevation and ice--limited to 20 minutes as a time. .  10. Polysubstance abuse: Patient doesn't perceive any problems with his substance use 11. RUE strain: Due to continued trapeze bar use. Will d/c and educate on alternative methods.   12. Rash upper back: Likely heat rash. Continue microguard powder for antifungal coverage and will add steroid cream to help with itching.   LOS (Days) 3 A FACE TO FACE EVALUATION WAS PERFORMED  Ioanna Colquhoun E 08/22/2014, 7:55 AM

## 2014-08-22 NOTE — Progress Notes (Signed)
Occupational Therapy Session Note  Patient Details  Name: [name] MRN: [id number] Date of Birth: [birth year]  Today's Date: 08/22/2014 OT Individual Time: 0800-0900 and 1100-1203 OT Individual Time Calculation (min): 60 min and 63 min   Short Term Goals: Week 1:  OT Short Term Goal 1 (Week 1): STGs equal to LTGs based on ELOS.  Skilled Therapeutic Interventions/Progress Updates:  Session 1: Upon entering the room, pt supine in bed with 3/10 pain in [name] hip. Pt performing supine >sit with min A and use of leg lifter.Pt transferring into wheelchair with RW and min A for stand pivot transfer. Pt refused standing for grooming tasks. Pt seated in wheelchair for grooming tasks at sink. Pt propelled self via wheelchair to atrium for community mobility task with wheelchair. Pt navigating wheelchair through small aisles with increased time by "weaving" in and out between tables and chairs without catching or hitting wheelchair on any other furniture. Pt required assistance to propel wheelchair back to room as he was fatigued. Pt seated in wheelchair with call bell and all other items within reach upon exiting the room. Pt applied ice packs to [name] hip and groin for pain management.   Session 2: Upon entering the room, pt with 7/10 c/o pain in [name] hip this session. RN arriving with medication for pain management. Skilled OT session with focus on self care retraining, functional transfers, activity tolerance, and functional mobility. Pt obtaining clothing and hygiene items for self care tasks from wheelchair level. Pt ambulating with RW ~ 10 feet into bathroom with min A for shower transfer. Stand pivot transfer onto TTB with use of RW and grab bar with min A. Bathing at shower level with use of LH sponge to wash B lower legs and feet. Pt dressing in bathroom with min verbal cues for safety. Use of LH reacher for LB clothing management and sock aide utilized as well to increase independence. Pt very fatigued and  reports pain has not decreased. Min A stand pivot transfer with RW into bed from wheelchair. Min A for sit >supine with pt utilizing Leg lifter. Ice packs applied to [name] LE per pt request for pain. Call bell and all needed items within reach upon exiting the room.   Therapy Documentation Precautions:  Precautions Precautions: Fall Precaution Comments: has strain in R shoulder from trapeze use Restrictions Weight Bearing Restrictions: Yes LLE Weight Bearing: Touchdown weight bearing Other Position/Activity Restrictions: Needs continuous cues to maintain WB status General:   Vital Signs: Therapy Vitals Temp: 99.2 F (37.3 C) Temp Source: Oral Pulse Rate: 96 Resp: 18 BP: 119/69 mmHg Patient Position (if appropriate): Lying Oxygen Therapy SpO2: 98 % O2 Device: Not Delivered  See FIM for current functional status  Therapy/Group: Individual Therapy  Lowella Gripittman, [name] 08/22/2014, 9:27 AM

## 2014-08-22 NOTE — IPOC Note (Addendum)
Overall Plan of Care [facility](IPOC) Patient Details Name: [name] MRN: [id number] DOB: 1975/05/07  Admitting Diagnosis: LEFT ACET FX TDWB NO SLP  Hospital Problems: Active Problems:   Acetabular fracture     Functional Problem List: Nursing Bowel, Edema, Endurance, Medication Management, Pain, Safety, Skin Integrity  PT Balance, Endurance, Edema, Motor, Pain, Safety  OT Balance, Pain, Safety  SLP    TR Edema, Endurance, Motor, Pain, Safety, Skin Integrity       Basic ADL's: OT Grooming, Bathing, Dressing, Toileting     Advanced  ADL's: OT Simple Meal Preparation     Transfers: PT Bed Mobility, Bed to Chair, Car, Occupational psychologisturniture  OT Toilet, Research scientist (life sciences)Tub/Shower     Locomotion: PT Ambulation, Psychologist, prison and probation servicesWheelchair Mobility, Stairs (w/c mobility for endurance)     Additional Impairments: OT None  SLP        TR      Anticipated Outcomes Item Anticipated Outcome  Self Feeding independent  Swallowing      Basic self-care  modified independent  Toileting  modified independent   Bathroom Transfers supervision to modified independent  Bowel/Bladder  patient will be continent of bowel and bladder without s/s of constipation  Transfers  mod I   Locomotion  mod I at an ambulatory level (S stairs)  Communication     Cognition     Pain  pain will be 4 or less on a scale of 0-10  Safety/Judgment  patient will be free from falls/injury and display appropriate safety measures   Therapy Plan: PT Intensity: Minimum of 1-2 x/day ,45 to 90 minutes PT Frequency: 5 out of 7 days PT Duration Estimated Length of Stay: 7-9 days  OT Intensity: Minimum of 1-2 x/day, 45 to 90 minutes OT Frequency: 5 out of 7 days OT Duration/Estimated Length of Stay: 7-9 days  TR Duration/ELOS:  10 Days TR Frequency:  Min 1 time per week >20 minutes        Team Interventions: Nursing Interventions Patient/Family Education, Bowel Management, Pain Management, Skin Care/Wound Management, Medication Management,  Discharge Planning  PT interventions Ambulation/gait training, Community reintegration, Discharge planning, DME/adaptive equipment instruction, Functional mobility training, Pain management, Patient/family education, Psychosocial support, Stair training, Therapeutic Activities, Therapeutic Exercise, UE/LE Strength taining/ROM, Wheelchair propulsion/positioning  OT Interventions Warden/rangerBalance/vestibular training, Self Care/advanced ADL retraining, Patient/family education, Therapeutic Activities, Community reintegration, Discharge planning, Functional mobility training, DME/adaptive equipment instruction, UE/LE Strength taining/ROM, UE/LE Coordination activities  SLP Interventions    TR Interventions Recreation/leisure participation, Balance/Vestibular training, functional mobility, therapeutic activities, UE/LE strength/coordination, w/c mobility, community reintegration, pt/family education, adaptive equipment instruction/use, discharge planning, psychosocial support  SW/CM Interventions Discharge Planning, Facilities managersychosocial Support, Patient/Family Education    Team Discharge Planning: Destination: PT-Home ,OT-  (To his mother's house) , SLP-  Projected Follow-up: PT-Home health PT, OT-   , SLP-  Projected Equipment Needs: PT-Rolling walker with 5" wheels, OT- 3 in 1 bedside comode, SLP-  Equipment Details: PT-to be determined if pt needing w/c, OT-  Patient/family involved in discharge planning: PT- Patient,  OT-Patient, SLP-   MD ELOS: 7-8d Medical Rehab Prognosis:  Excellent Assessment: 39 y.o. male who was admitted on 08/11/14 after being thrown off his horse which subsequently landed on top of him. UDS positive for cocaine and THC. Patient was found to have severely comminuted both column acetabulum fracture and was placed in traction by Dr. August Saucerean. He was evaluated by Dr [name] and underwent ORIF left both column acetabular fracture with removal of tibial pin. Post op TDWB LLE X  12 weeks with  recommendations for lovenox X 20 days followed by ASA bid.   Now requiring 24/7 Rehab RN,MD, as well as CIR level PT, OT.  Treatment team will focus on ADLs and mobility with goals set at Mod I  See Team Conference Notes for weekly updates to the plan of care

## 2014-08-23 ENCOUNTER — Inpatient Hospital Stay (HOSPITAL_COMMUNITY): Payer: BLUE CROSS/BLUE SHIELD | Admitting: Occupational Therapy

## 2014-08-23 ENCOUNTER — Inpatient Hospital Stay (HOSPITAL_COMMUNITY): Payer: PRIVATE HEALTH INSURANCE | Admitting: Rehabilitation

## 2014-08-23 MED ORDER — METOCLOPRAMIDE HCL 5 MG PO TABS
5.0000 mg | ORAL_TABLET | Freq: Two times a day (BID) | ORAL | Status: AC
  Administered 2014-08-24 – 2014-08-25 (×2): 5 mg via ORAL
  Filled 2014-08-23 (×2): qty 1

## 2014-08-23 MED ORDER — METOCLOPRAMIDE HCL 5 MG PO TABS
5.0000 mg | ORAL_TABLET | Freq: Three times a day (TID) | ORAL | Status: AC
  Administered 2014-08-23 – 2014-08-24 (×3): 5 mg via ORAL
  Filled 2014-08-23 (×3): qty 1

## 2014-08-23 MED ORDER — MORPHINE SULFATE ER 15 MG PO TBCR
30.0000 mg | EXTENDED_RELEASE_TABLET | Freq: Two times a day (BID) | ORAL | Status: DC
  Administered 2014-08-23 – 2014-08-25 (×5): 30 mg via ORAL
  Filled 2014-08-23 (×5): qty 2

## 2014-08-23 MED ORDER — METOCLOPRAMIDE HCL 5 MG PO TABS
5.0000 mg | ORAL_TABLET | Freq: Three times a day (TID) | ORAL | Status: DC
  Filled 2014-08-23 (×2): qty 1

## 2014-08-23 NOTE — Progress Notes (Signed)
Occupational Therapy Session Note  Patient Details  Name: [name] MRN: [id number] Date of Birth: 09-20-1975  Today's Date: 08/23/2014 OT Individual Time:  -   0830-0930   (60 min)  1st session                                           1500-1600  (60 min)  2nd session      Short Term Goals: Week 1:  OT Short Term Goal 1 (Week 1): STGs equal to LTGs based on ELOS.  Skilled Therapeutic Interventions/Progress Updates:   1st session:   Pt. Sitting in wc upon OT arrival.  He reported he had already bathed and dressed.  Addressed wc mobility, transfers to mat, sitting balance on EOB, UE Therapeutic activities with 6 # weight, sit to stand x5 in 45 seconds with walker.  Pt propelled wc to gym and transferred with SBA with RW.  Went from sit to supine with mod assist using leg lifter.  Pt's pain level climbed in supine and returned to sitting.  Performed UE therapeutic AROM with 6 # weights in all planes and on right and left side.  Performed knee flexion and extension and leg raises.  Did 3 sets of 10.  Propelled wc to toilet via walker and cues for TDWB on left.  Did transfer to toilet with min to SBA.  Pt performed clothes and peri care.  Returned to standing at sink to wash hands with SBA.  Backwards walking to wc with min assist.  Left pt in wc with nursing providing pain meds.    2nd session:  Addressed wc mobility, car transfers, mat transfers, UE therapeutic exercise.  Pt. Transferred in/out of car with leg lifter and increased time and min to SBA.  Pt practiced 5 times with transfer with emphasis of getting leg in/out using leg lifter.  Addressed lifting leg on/off of mat x3.  Used 5 # weights for BUE exercises.  Pt. Propelled self back to room and left with all needs in reach.    Therapy Documentation Precautions:  Precautions Precautions: Fall Precaution Comments: has strain in R shoulder from trapeze use Restrictions Weight Bearing Restrictions: Yes LLE Weight Bearing: Touchdown  weight bearing Other Position/Activity Restrictions: Needs continuous cues to maintain WB status General:   Vital Signs: Therapy Vitals Temp: 98.5 F (36.9 C) Temp Source: Oral Pulse Rate: 98 Resp: 18 BP: (!) 108/54 mmHg Patient Position (if appropriate): Lying Oxygen Therapy SpO2: 96 % O2 Device: Not Delivered Pain: Pain Assessment Pain Assessment: 0-10 Pain Score: Ranged from 2-8 with movement activities in 1st session;  2nd session:  Ranged from 2- 4 with movements Pain Type: Acute pain Pain Location: Hip Pain Orientation: Left Pain Onset: Gradual Patients Stated Pain Goal: 2 Pain Intervention(s): Medication (See eMAR)         See FIM for current functional status  Therapy/Group: Individual Therapy  [name], [name] 08/23/2014, 8:21 AM

## 2014-08-23 NOTE — Progress Notes (Addendum)
Recreational Therapy Assessment and Plan  Patient Details  Name: [name] MRN: [id number] Date of Birth: 1976-04-16 Today's Date: 08/23/2014  Rehab Potential: Good ELOS: 10 days   Assessment Clinical Impression: Problem List:  Patient Active Problem List   Diagnosis Date Noted  . Vitamin D insufficiency 08/19/2014  . Testosterone deficiency 08/19/2014  . Acetabular fracture 08/19/2014  . Fall from horse 08/13/2014  . Acute blood loss anemia 08/13/2014  . Pelvic fracture 08/12/2014  . HYPERGLYCEMIA, FASTING 06/29/2010    Past Medical History:  Past Medical History  Diagnosis Date  . History of chicken pox   . History of kidney stones    Past Surgical History:  Past Surgical History  Procedure Laterality Date  . Tonsillectomy    . Orif acetabular fracture Left 08/13/2014    Procedure: LEFT OPEN REDUCTION INTERNAL FIXATION (ORIF) ACETABULAR FRACTURE; Surgeon: Altamese Yakutat, MD; Location: [place]; Service: Orthopedics; Laterality: Left;    Assessment & Plan Clinical Impression: Patient is a 39 y.o. year old male with was admitted on 08/11/14 after being thrown off his horse which subsequently landed on top of him. UDS positive for cocaine and THC. Patient was found to have severely comminuted both column acetabulum fracture and was placed in traction by Dr. Marlou Sa. He was evaluated by Dr Marcelino Scot and underwent ORIF left both column acetabular fracture with removal of tibial pin. Post op TDWB LLE X 12 weeks with recommendations for lovenox X 20 days followed by ASA bid. ABLA treated with 2 units PRBC. Patient has had issues with scrotal pain as well as abdominal discomfort due to ileus and was started on bowel program without results? He had had recent tooth abscess treated with clindamycin. Low Vitamin D levels likely due to cannabis use and supplementation recommended by ortho. Patient transferred to CIR on 08/19/2014.       Pt presents with decreased activity tolerance, decreased functional mobility, decreased balance, increased pain Limiting pt's independence with leisure/community pursuits.     Plan Rec Therapy Plan Is patient appropriate for Therapeutic Recreation?: Yes Rehab Potential: Good Treatment times per week: Min 1 time per week >20 minutes Estimated Length of Stay: 10 days TR Treatment/Interventions: Adaptive equipment instruction;1:1 session;Balance/vestibular training;Functional mobility training;Community reintegration;Patient/family education;Therapeutic activities;Recreation/leisure participation;Therapeutic exercise;UE/LE Coordination activities;Wheelchair propulsion/positioning  Recommendations for other services: None  Discharge Criteria: Patient will be discharged from TR if patient refuses treatment 3 consecutive times without medical reason.  If treatment goals not met, if there is a change in medical status, if patient makes no progress towards goals or if patient is discharged from hospital.  The above assessment, treatment plan, treatment alternatives and goals were discussed and mutually agreed upon: by patient     Session/Treatment Note for 08/23/14 2/10 left hip pain, premedicated Co-treat with PT with focus on stair negotiation using RW with +2 assist (min assist for balance +2 to steady RW) and min verbal cues for adherence to WB status.  Pt then stood without UE support for ball toss activity with min assist ~2 minutes.  [place] 08/23/2014, 1:38 PM

## 2014-08-23 NOTE — Progress Notes (Addendum)
39 y.o. male who was admitted on 08/11/14 after being thrown off his horse which subsequently landed on top of him. UDS positive for cocaine and THC. Patient was found to have severely comminuted both column acetabulum fracture and was placed in traction by Dr. Marlou Sa. He was evaluated by Dr Marcelino Scot and underwent ORIF left both column acetabular fracture with removal of tibial pin. Post op TDWB LLE X 12 weeks with recommendations for lovenox X 20 days followed by ASA bid. ABLA treated with 2 units PRBC. Patient has had issues with scrotal pain as well as abdominal discomfort due to ileus and was started on bowel program without results? He had had recent tooth abscess treated with clindamycin. Subjective/Complaints:  "can I have solid food?" 2 BMs yesterday     ROS:  Neg except pain in left hip   Objective: Vital Signs: Blood pressure 108/54, pulse 98, temperature 98.5 F (36.9 C), temperature source Oral, resp. rate 18, weight 97.5 kg (214 lb 15.2 oz), SpO2 96 %. No results found. Results for orders placed or performed during the hospital encounter of 08/19/14 (from the past 72 hour(s))  CBC WITH DIFFERENTIAL     Status: Abnormal   Collection Time: 08/20/14  7:50 AM  Result Value Ref Range   WBC 6.6 4.0 - 10.5 K/uL   RBC 2.96 (L) 4.22 - 5.81 MIL/uL   Hemoglobin 8.1 (L) 13.0 - 17.0 g/dL   HCT 25.4 (L) 39.0 - 52.0 %   MCV 85.8 78.0 - 100.0 fL   MCH 27.4 26.0 - 34.0 pg   MCHC 31.9 30.0 - 36.0 g/dL   RDW 14.9 11.5 - 15.5 %   Platelets 294 150 - 400 K/uL   Neutrophils Relative % 71 43 - 77 %   Neutro Abs 4.7 1.7 - 7.7 K/uL   Lymphocytes Relative 17 12 - 46 %   Lymphs Abs 1.1 0.7 - 4.0 K/uL   Monocytes Relative 9 3 - 12 %   Monocytes Absolute 0.6 0.1 - 1.0 K/uL   Eosinophils Relative 3 0 - 5 %   Eosinophils Absolute 0.2 0.0 - 0.7 K/uL   Basophils Relative 0 0 - 1 %   Basophils Absolute 0.0 0.0 - 0.1 K/uL  Comprehensive metabolic panel     Status: Abnormal   Collection Time: 08/20/14   7:50 AM  Result Value Ref Range   Sodium 132 (L) 135 - 145 mmol/L   Potassium 4.0 3.5 - 5.1 mmol/L   Chloride 96 (L) 101 - 111 mmol/L   CO2 26 22 - 32 mmol/L   Glucose, Bld 110 (H) 70 - 99 mg/dL   BUN 11 6 - 20 mg/dL   Creatinine, Ser 0.83 0.61 - 1.24 mg/dL   Calcium 8.7 (L) 8.9 - 10.3 mg/dL   Total Protein 5.9 (L) 6.5 - 8.1 g/dL   Albumin 2.8 (L) 3.5 - 5.0 g/dL   AST 33 15 - 41 U/L   ALT 50 17 - 63 U/L   Alkaline Phosphatase 71 38 - 126 U/L   Total Bilirubin 1.6 (H) 0.3 - 1.2 mg/dL   GFR calc non Af Amer >60 >60 mL/min   GFR calc Af Amer >60 >60 mL/min    Comment: (NOTE) The eGFR has been calculated using the CKD EPI equation. This calculation has not been validated in all clinical situations. eGFR's persistently <90 mL/min signify possible Chronic Kidney Disease.    Anion gap 10 5 - 15  Basic metabolic panel  Status: Abnormal   Collection Time: 08/21/14  8:10 AM  Result Value Ref Range   Sodium 131 (L) 135 - 145 mmol/L   Potassium 4.0 3.5 - 5.1 mmol/L   Chloride 95 (L) 101 - 111 mmol/L   CO2 27 22 - 32 mmol/L   Glucose, Bld 113 (H) 70 - 99 mg/dL   BUN 12 6 - 20 mg/dL   Creatinine, Ser 0.82 0.61 - 1.24 mg/dL   Calcium 8.8 (L) 8.9 - 10.3 mg/dL   GFR calc non Af Amer >60 >60 mL/min   GFR calc Af Amer >60 >60 mL/min    Comment: (NOTE) The eGFR has been calculated using the CKD EPI equation. This calculation has not been validated in all clinical situations. eGFR's persistently <90 mL/min signify possible Chronic Kidney Disease.    Anion gap 9 5 - 15     HEENT: normal Cardio: RRR and no murmur Resp: CTA B/L and unlabored GI: BS positive and NT ND Extremity:  Edema Left thigh,  Neuro: Alert/Oriented, Normal Sensory and Abnormal Motor trace left hip flexio and 3- knee ext (pain limited), 3/5 L ADF/PF, 5/5 in BUE and RLE Musc/Skel:  Swelling Left thigh Gen NAD   Assessment/Plan: 1. Functional deficits secondary to Left acetabular fracture TDWB x 8wks which  require 3+ hours per day of interdisciplinary therapy in a comprehensive inpatient rehab setting. Physiatrist is providing close team supervision and 24 hour management of active medical problems listed below. Physiatrist and rehab team continue to assess barriers to discharge/monitor patient progress toward functional and medical goals.  FIM: FIM - Bathing Bathing Steps Patient Completed: Chest, Right Arm, Left Arm, Abdomen, Front perineal area, Buttocks, Right upper leg, Left upper leg, Right lower leg (including foot), Left lower leg (including foot) Bathing: 4: Steadying assist  FIM - Upper Body Dressing/Undressing Upper body dressing/undressing steps patient completed: Put head through opening of pull over shirt/dress, Thread/unthread left sleeve of pullover shirt/dress, Thread/unthread right sleeve of pullover shirt/dresss, Pull shirt over trunk Upper body dressing/undressing: 5: Supervision: Safety issues/verbal cues FIM - Lower Body Dressing/Undressing Lower body dressing/undressing steps patient completed: Thread/unthread right pants leg, Pull pants up/down, Don/Doff right sock Lower body dressing/undressing: 3: Mod-Patient completed 50-74% of tasks  FIM - Toileting Toileting steps completed by patient: Adjust clothing prior to toileting, Performs perineal hygiene Toileting Assistive Devices: Grab bar or rail for support Toileting: 3: Mod-Patient completed 2 of 3 steps  FIM - Radio producer Devices: Grab bars Toilet Transfers: 4-To toilet/BSC: Min A (steadying Pt. > 75%), 4-From toilet/BSC: Min A (steadying Pt. > 75%)  FIM - Bed/Chair Transfer Bed/Chair Transfer Assistive Devices: Walker, Arm rests Bed/Chair Transfer: 5: Supine > Sit: Supervision (verbal cues/safety issues), 5: Sit > Supine: Supervision (verbal cues/safety issues), 5: Chair or W/C > Bed: Supervision (verbal cues/safety issues), 5: Bed > Chair or W/C: Supervision (verbal cues/safety  issues)  FIM - Locomotion: Wheelchair Distance: 150 Locomotion: Wheelchair: 5: Travels 150 ft or more: maneuvers on rugs and over door sills with supervision, cueing or coaxing FIM - Locomotion: Ambulation Locomotion: Ambulation Assistive Devices: Administrator Ambulation/Gait Assistance: 4: Min assist Locomotion: Ambulation: 1: Travels less than 50 ft with minimal assistance (Pt.>75%)  Comprehension Comprehension Mode: Auditory Comprehension: 7-Follows complex conversation/direction: With no assist  Expression Expression Mode: Verbal Expression: 7-Expresses complex ideas: With no assist  Social Interaction Social Interaction: 7-Interacts appropriately with others - No medications needed.  Problem Solving Problem Solving: 6-Solves complex problems: With extra time  Memory Memory: 6-More than reasonable amt of time  Medical Problem List and Plan: 1. Functional deficits secondary to Left acetabular fracture--TTWB X 8 weeks.  2. DVT Prophylaxis/Anticoagulation: Pharmaceutical: Lovenox D# 9/ 21 followed by ASA 325 mg bid.  3. Pain Management:MS contin decrease to BID . Continue oxycodone 15 mg prn.  4. Mood: LCSW to follow for evaluation and support.  5. Neuropsych: This patient is capable of making decisions on his own behalf. 6. Skin/Wound Care: routine pressure relief measures.  7. Fluids/Electrolytes/Nutrition: Monitor I/O. Offer supplements as needed. Check lytes in am. 8. ABLA: Will continue to monitor. Recheck CBC in am.  9. Scrotal trauma/edema: Local treatment with elevation and ice--limited to 20 minutes as a time. .  10. Polysubstance abuse: Patient doesn't perceive any problems with his substance use 11.  Neck and back pain not severe, pt thinks it is related to his bed positioning 12. Post op ileus improving as mobility increased, advance diet to soft mech LOS (Days) 4 A FACE TO FACE EVALUATION WAS PERFORMED  [name] E 08/23/2014, 7:16 AM

## 2014-08-23 NOTE — Progress Notes (Signed)
Social Work Patient ID: [name], male   DOB: 01/20/1976, 39 y.o.   MRN: [id number]   CSW met with pt to update him on team conference discussion.  Pt was pleased that he would be d/c'd next week and reports already being able to see progress.  Pt has no current concerns/questions/needs, but CSW will remain available as needs arise.  When we near pt's d/c, CSW will arrange pt's DME and f/u care.

## 2014-08-23 NOTE — Patient Care Conference (Signed)
Inpatient RehabilitationTeam Conference and Plan of Care Update Date: 08/21/2014   Time: 11:15 AM    Patient Name: [name]      Medical Record Number: [id number]  Date of Birth: [birth year] Sex: Male         Room/Bed: 4M06C/4M06C-01 Payor Info: Payor: MEDCOST / Plan: MEDCOST / Product Type: *No Product type* /    Admitting Diagnosis: LEFT ACET FX TDWB NO SLP  Admit Date/Time:  08/19/2014  4:44 PM Admission Comments: No comment available   Primary Diagnosis:  <principal problem not specified> Principal Problem: <principal problem not specified>  Patient Active Problem List   Diagnosis Date Noted  . Vitamin D insufficiency 08/19/2014  . Testosterone deficiency 08/19/2014  . Acetabular fracture 08/19/2014  . Fall from horse 08/13/2014  . Acute blood loss anemia 08/13/2014  . Pelvic fracture 08/12/2014  . HYPERGLYCEMIA, FASTING 06/29/2010    Expected Discharge Date: Expected Discharge Date: 08/28/14  Team Members Present: Physician leading conference: Dr. Claudette LawsAndrew Kirsteins Social Worker Present: Staci AcostaJenny Prevatt, LCSW Nurse Present: Chana Bodeeborah Sharp, RN PT Present: Harriet ButteEmily Parcell, PT;Rodney Leo GrosserWishart, PT OT Present: Callie FieldingKatie Pittman, OT SLP Present: Jackalyn LombardNicole Page, SLP PPS Coordinator present : Tora DuckMarie Noel, RN, CRRN     Current Status/Progress Goal Weekly Team Focus  Medical   pain in scrotum and Left hip  Home d/c  Initiate therapy   Bowel/Bladder   continent of bowel and bladder  remain continent of bowel and bladder  educate patient ab s/s of narcotin induced constipation   Swallow/Nutrition/ Hydration     na        ADL's   supervision UB selfcare, mod assist LB selfcare, min assist for transfers with cueing to maintain TDWBing status.    modified independent to supervison  selfcare re-training, balance retraining, transfer training, pain management, AE/DME education   Mobility   Pt is currently mod A for bed mobility, min/guard to min A for transfers, min A short distance gait  with continued cuing for TDWB, and mod A stairs.   mod I overall  gait, balance, pain management, stairs, bed mobility, pt/family education.    Communication     na        Safety/Cognition/ Behavioral Observations    no unsafe behaviors         Pain   patient reports surgical pain to abdomen, pain to L hip and leg  pain equal to or less than 4 on a scale of 0-10  assess pain q4h and prn, midicate as indicated.   Skin   surgical wounds to abdomen, suturea patent, edges well approximated, no drainage. bruising to abdomen, L hip and L leg  no further skin breakdown/injury  assess skin q shift.    Rehab Goals Patient on target to meet rehab goals: Yes Rehab Goals Revised: none *See Care Plan and progress notes for long and short-term goals.  Barriers to Discharge: None, pain    Possible Resolutions to Barriers:  d/c in ~1 wk, wean meds    Discharge Planning/Teaching Needs:  Pt plans to go to his mother's home at d/c.  Pt's goals are mostly modified independent and is able to direct his care with his family.   Team Discussion:  Dr. Wynn BankerKirsteins is trying to decrease pt's pain medication due to illeus.  Pt is doing well with PT and is at min assist overall and his hip/scrotum/groin pain is what limits him the most.  Pt is receiving education from PT on his posture and NWB  status.  In and out of bed and stairs will be the hardest for him, with keeping his precautions.  OT will try pt in the shower.  Revisions to Treatment Plan:  none   Continued Need for Acute Rehabilitation Level of Care: The patient requires daily medical management by a physician with specialized training in physical medicine and rehabilitation for the following conditions: Daily direction of a multidisciplinary physical rehabilitation program to ensure safe treatment while eliciting the highest outcome that is of practical value to the patient.: Yes Daily medical management of patient stability for increased activity  during participation in an intensive rehabilitation regime.: Yes Daily analysis of laboratory values and/or radiology reports with any subsequent need for medication adjustment of medical intervention for : Neurological problems  Archie Shea, Lemar LivingsRebecca G 08/23/2014, 1:47 PM

## 2014-08-23 NOTE — Progress Notes (Signed)
Physical Therapy Session Note  Patient Details  Name: [name] L Sebastiani MRN: [id number] Date of Birth: 1976/02/22  Today's Date: 08/23/2014 PT Individual Time: 0930-1035 PT Individual Time Calculation (min): 65 min   Short Term Goals: Week 1:  PT Short Term Goal 1 (Week 1): =LTG's due to ELOS  Skilled Therapeutic Interventions/Progress Updates:   Pt received sitting in w/c in room, having just finished OT session.  RN present to provide pain meds and muscle relaxer prior to leaving room.  Pt stating increased pain in L hip and groin area following lying flat with OT.  Skilled session focused on stair negotiation to simulate home entry, dynamic standing balance for activity tolerance as well, gait training with use of RW for quality and improved distance as well as w/c propulsion in community setting to prepare for safe D/C.  Pt continues to have increased difficulty with L foot clearance and tends to "crawl" L foot forward due to pain with hip and knee flex.  Performed stairs at min A level (+2A to steady RW) with cues for WB status and safety.  Performed 3 reps of standing x 1-2 mins each without UE support at min/guard level to address standing tolerance and balance.  Increased pain during task, therefore ended task.  Ended session with w/c propulsion outside in community setting at S level >300' on uneven surfaces, carpet, and incline/decline.  Cues for safety throughout.  Once back on unit, ambulated short distance approx 10' to recliner at min/guard to close S level with continued cues for posture and increasing L hip/knee flex as able.  Left in recliner with all needs in reach and ice packs donned for pain and swelling control.    Therapy Documentation Precautions:  Precautions Precautions: Fall Precaution Comments: has strain in R shoulder from trapeze use Restrictions Weight Bearing Restrictions: Yes LLE Weight Bearing: Touchdown weight bearing Other Position/Activity Restrictions: Needs  continuous cues to maintain WB status   Vital Signs: Therapy Vitals Temp: 98.5 F (36.9 C) Temp Source: Oral Pulse Rate: 98 Resp: 18 BP: (!) 108/54 mmHg Patient Position (if appropriate): Lying Oxygen Therapy SpO2: 96 % O2 Device: Not Delivered Pain: Pain Assessment Pain Assessment: 0-10 Pain Score: 2  Pain Type: Acute pain Pain Location: Hip Pain Orientation: Left Pain Onset: Gradual Patients Stated Pain Goal: 2 Pain Intervention(s): Medication (See eMAR)    See FIM for current functional status  Therapy/Group: Individual Therapy  Vista Deckarcell, Alexza Norbeck Ann 08/23/2014, 9:51 AM

## 2014-08-24 ENCOUNTER — Inpatient Hospital Stay (HOSPITAL_COMMUNITY): Payer: BLUE CROSS/BLUE SHIELD | Admitting: Physical Therapy

## 2014-08-24 ENCOUNTER — Inpatient Hospital Stay (HOSPITAL_COMMUNITY): Payer: BLUE CROSS/BLUE SHIELD | Admitting: Occupational Therapy

## 2014-08-24 DIAGNOSIS — S32409S Unspecified fracture of unspecified acetabulum, sequela: Secondary | ICD-10-CM

## 2014-08-24 MED ORDER — POLYETHYLENE GLYCOL 3350 17 G PO PACK
17.0000 g | PACK | Freq: Every day | ORAL | Status: DC
  Administered 2014-08-25: 17 g via ORAL
  Filled 2014-08-24 (×3): qty 1

## 2014-08-24 NOTE — Progress Notes (Signed)
Occupational Therapy Session Note  Patient Details  Name: Oralia RudSteven L Soutar MRN: [id number] Date of Birth: 04-07-1976  Today's Date: 08/24/2014 OT Individual Time: 1445-1545 OT Individual Time Calculation (min): 60 min    Short Term Goals: Week 1:  OT Short Term Goal 1 (Week 1): STGs equal to LTGs based on ELOS. Week 2:     Skilled Therapeutic Interventions/Progress Updates:    Addressed wc mobility, car transfers, tub transfers, . Pt. Transferred in/out of car with leg lifter and increased time and min to SBA. 5 times with  emphasis of getting leg in/out using leg lifter. Addressed tub transfer x5 with SBA with pt using leg lifter.  Propelled self back to room and left with all needs in reach.    Therapy Documentation Precautions:  Precautions Precautions: Fall Precaution Comments: has strain in R shoulder from trapeze use Restrictions Weight Bearing Restrictions: Yes LLE Weight Bearing: Touchdown weight bearing Other Position/Activity Restrictions: Needs continuous cues to maintain WB status       Pain: Pain Assessment Pain Score: 2  Pain Type: Acute pain Pain Location: Shoulder Pain Orientation: Right Pain Descriptors / Indicators: Aching Pain Onset: Gradual Pain Intervention(s): Medication (See eMAR)         See FIM for current functional status  Therapy/Group: Individual Therapy  [name], Suhaila Troiano J 08/24/2014, 7:08 PM

## 2014-08-24 NOTE — Progress Notes (Signed)
Physical Therapy Session Note  Patient Details  Name: [name] MRN: [id number] Date of Birth: 12/26/1975  Today's Date: 08/24/2014 PT Individual Time: 1345-1415 PT Individual Time Calculation (min): 30 min   Short Term Goals: Week 1:  PT Short Term Goal 1 (Week 1): =LTG's due to ELOS  Skilled Therapeutic Interventions/Progress Updates:  Pt was seen bedside in the pm, sitting on edge of bed. Pt performed multiple sit to stand transfers and transferred edge of bed to w/c with rolling walker and min guard with verbal cues for WB status. Pt ambulated about 34 feet with rolling walker and min guard with verbal cues to maintain WB status. Pt performed hip flexion L LE, 2 sets x 10 reps in standing. Pt left sitting up in w/c with call bell within reach.   Therapy Documentation Precautions:  Precautions Precautions: Fall Precaution Comments: has strain in R shoulder from trapeze use Restrictions Weight Bearing Restrictions: Yes LLE Weight Bearing: Touchdown weight bearing Other Position/Activity Restrictions: Needs continuous cues to maintain WB status General:  Pain: Pt c/o soreness in L hip area.   Locomotion : Ambulation Ambulation/Gait Assistance: 4: Min guard   See FIM for current functional status  Therapy/Group: Individual Therapy  Rayford HalstedMitchell, Vincenza Dail G 08/24/2014, 3:08 PM

## 2014-08-24 NOTE — Progress Notes (Addendum)
39 y.o. male who was admitted on 08/11/14 after being thrown off his horse which subsequently landed on top of him. UDS positive for cocaine and THC. Patient was found to have severely comminuted both column acetabulum fracture and was placed in traction by Dr. Marlou Sa. He was evaluated by Dr Marcelino Scot and underwent ORIF left both column acetabular fracture with removal of tibial pin. Post op TDWB LLE X 12 weeks with recommendations for lovenox X 20 days followed by ASA bid. ABLA treated with 2 units PRBC. Patient has had issues with scrotal pain as well as abdominal discomfort due to ileus and was started on bowel program without results? He had had recent tooth abscess treated with clindamycin. Subjective/Complaints: Tolerated soft diet, pt feels ready to advance diet 4 BMs yesterday     ROS:  Neg except pain in left hip   Objective: Vital Signs: Blood pressure 118/66, pulse 93, temperature 98.2 F (36.8 C), temperature source Oral, resp. rate 18, weight 97.5 kg (214 lb 15.2 oz), SpO2 95 %. No results found. Results for orders placed or performed during the hospital encounter of 08/19/14 (from the past 72 hour(s))  Basic metabolic panel     Status: Abnormal   Collection Time: 08/21/14  8:10 AM  Result Value Ref Range   Sodium 131 (L) 135 - 145 mmol/L   Potassium 4.0 3.5 - 5.1 mmol/L   Chloride 95 (L) 101 - 111 mmol/L   CO2 27 22 - 32 mmol/L   Glucose, Bld 113 (H) 70 - 99 mg/dL   BUN 12 6 - 20 mg/dL   Creatinine, Ser 0.82 0.61 - 1.24 mg/dL   Calcium 8.8 (L) 8.9 - 10.3 mg/dL   GFR calc non Af Amer >60 >60 mL/min   GFR calc Af Amer >60 >60 mL/min    Comment: (NOTE) The eGFR has been calculated using the CKD EPI equation. This calculation has not been validated in all clinical situations. eGFR's persistently <90 mL/min signify possible Chronic Kidney Disease.    Anion gap 9 5 - 15     HEENT: normal Cardio: RRR and no murmur Resp: CTA B/L and unlabored GI: BS positive and NT  ND Extremity:  Edema Left thigh, Skin- suprapubic and left inguinal incisions CDI, looking great Neuro: Alert/Oriented, Normal Sensory and Abnormal Motor trace left hip flexio and 3- knee ext (pain limited), 3/5 L ADF/PF, 5/5 in BUE and RLE Musc/Skel:  Swelling Left thigh Gen NAD   Assessment/Plan: 1. Functional deficits secondary to Left acetabular fracture TDWB x 8wks which require 3+ hours per day of interdisciplinary therapy in a comprehensive inpatient rehab setting. Physiatrist is providing close team supervision and 24 hour management of active medical problems listed below. Physiatrist and rehab team continue to assess barriers to discharge/monitor patient progress toward functional and medical goals.  FIM: FIM - Bathing Bathing Steps Patient Completed: Chest, Right Arm, Left Arm, Abdomen, Front perineal area, Buttocks, Right upper leg, Left upper leg, Right lower leg (including foot), Left lower leg (including foot) Bathing: 4: Steadying assist  FIM - Upper Body Dressing/Undressing Upper body dressing/undressing steps patient completed: Put head through opening of pull over shirt/dress, Thread/unthread left sleeve of pullover shirt/dress, Thread/unthread right sleeve of pullover shirt/dresss, Pull shirt over trunk Upper body dressing/undressing: 5: Supervision: Safety issues/verbal cues FIM - Lower Body Dressing/Undressing Lower body dressing/undressing steps patient completed: Thread/unthread right pants leg, Pull pants up/down, Don/Doff right sock Lower body dressing/undressing: 3: Mod-Patient completed 50-74% of tasks  FIM - Toileting  Toileting steps completed by patient: Adjust clothing prior to toileting, Performs perineal hygiene Toileting Assistive Devices: Grab bar or rail for support Toileting: 3: Mod-Patient completed 2 of 3 steps  FIM - Radio producer Devices: Grab bars Toilet Transfers: 4-To toilet/BSC: Min A (steadying Pt. > 75%),  4-From toilet/BSC: Min A (steadying Pt. > 75%)  FIM - Bed/Chair Transfer Bed/Chair Transfer Assistive Devices: Walker, Arm rests Bed/Chair Transfer: 0: Activity did not occur  FIM - Locomotion: Wheelchair Distance: 300 Locomotion: Wheelchair: 5: Travels 150 ft or more: maneuvers on rugs and over door sills with supervision, cueing or coaxing FIM - Locomotion: Ambulation Locomotion: Ambulation Assistive Devices: Administrator Ambulation/Gait Assistance: 4: Min assist Locomotion: Ambulation: 1: Travels less than 50 ft with minimal assistance (Pt.>75%)  Comprehension Comprehension Mode: Auditory Comprehension: 7-Follows complex conversation/direction: With no assist  Expression Expression Mode: Verbal Expression: 7-Expresses complex ideas: With no assist  Social Interaction Social Interaction: 7-Interacts appropriately with others - No medications needed.  Problem Solving Problem Solving: 6-Solves complex problems: With extra time  Memory Memory: 6-More than reasonable amt of time  Medical Problem List and Plan: 1. Functional deficits secondary to Left acetabular fracture--TTWB X 8 weeks.  2. DVT Prophylaxis/Anticoagulation: Pharmaceutical: Lovenox D# 10/ 21 followed by ASA 325 mg bid.  3. Pain Management:MS contin decrease to BID . Continue oxycodone 15 mg prn.  4. Mood: LCSW to follow for evaluation and support.  5. Neuropsych: This patient is capable of making decisions on his own behalf. 6. Skin/Wound Care: routine pressure relief measures.  7. Fluids/Electrolytes/Nutrition: Monitor I/O. Offer supplements as needed. Check lytes in am. 8. ABLA: Will continue to monitor. Recheck CBC in am.  9. Scrotal trauma/edema: Local treatment with elevation and ice--limited to 20 minutes as a time. .  10. Polysubstance abuse: Patient doesn't perceive any problems with his substance use 11.  Neck and back pain not severe, pt thinks it is related to his bed positioning 12.  Post op ileus improving as mobility increased, advance diet to regular and monitor, may reduce laxatives since opioids are being tapered LOS (Days) 5 A FACE TO FACE EVALUATION WAS PERFORMED  [name] 08/24/2014, 7:45 AM

## 2014-08-25 ENCOUNTER — Inpatient Hospital Stay (HOSPITAL_COMMUNITY): Payer: BLUE CROSS/BLUE SHIELD | Admitting: Occupational Therapy

## 2014-08-25 ENCOUNTER — Inpatient Hospital Stay (HOSPITAL_COMMUNITY): Payer: PRIVATE HEALTH INSURANCE | Admitting: Physical Therapy

## 2014-08-25 DIAGNOSIS — S3022XS Contusion of scrotum and testes, sequela: Secondary | ICD-10-CM

## 2014-08-25 MED ORDER — ZOLPIDEM TARTRATE 5 MG PO TABS
5.0000 mg | ORAL_TABLET | Freq: Every day | ORAL | Status: DC
  Administered 2014-08-25 – 2014-08-27 (×3): 5 mg via ORAL
  Filled 2014-08-25 (×3): qty 1

## 2014-08-25 MED ORDER — POLYETHYLENE GLYCOL 3350 17 G PO PACK
17.0000 g | PACK | Freq: Two times a day (BID) | ORAL | Status: DC
  Administered 2014-08-25 – 2014-08-27 (×3): 17 g via ORAL
  Filled 2014-08-25 (×8): qty 1

## 2014-08-25 NOTE — Progress Notes (Signed)
Occupational Therapy Session Note  Patient Details  Name: [name] MRN: [id number] Date of Birth: [birth year]  Today's Date: 08/25/2014 OT Individual Time: [id number] OT Individual Time Calculation (min): 53 min    Skilled Therapeutic Interventions/Progress Updates: patient participated in transfer and pelvic motility, stability and stretching EOM.        Therapy Documentation Precautions:  Precautions Precautions: Fall Precaution Comments: has strain in R shoulder from trapeze use Restrictions Weight Bearing Restrictions: Yes LLE Weight Bearing: Touchdown weight bearing Other Position/Activity Restrictions: Needs continuous cues to maintain WB status  Pain: denied  See FIM for current functional status  Therapy/Group: Individual Therapy  [name], [name] [facility] 08/25/2014, 4:06 PM

## 2014-08-25 NOTE — Progress Notes (Signed)
39 y.o. male who was admitted on 08/11/14 after being thrown off his horse which subsequently landed on top of him. UDS positive for cocaine and THC. Patient was found to have severely comminuted both column acetabulum fracture and was placed in traction by Dr. August Saucerean. He was evaluated by Dr [name] and underwent ORIF left both column acetabular fracture with removal of tibial pin. Post op TDWB LLE X 12 weeks with recommendations for lovenox X 20 days followed by ASA bid. ABLA treated with 2 units PRBC. Patient has had issues with scrotal pain as well as abdominal discomfort due to ileus and was started on bowel program without results? He had had recent tooth abscess treated with clindamycin. Subjective/Complaints:  No BM since 5/6,  Got behind on pain meds, "hurting"  Bed uncomfortable  Back and hip painObjective: Vital Signs: Blood pressure 113/61, pulse 91, temperature 98.9 F (37.2 C), temperature source Oral, resp. rate 18, weight 97.5 kg (214 lb 15.2 oz), SpO2 95 %. No results found. No results found for this or any previous visit (from the past 72 hour(s)).   HEENT: normal Cardio: RRR and no murmur Resp: CTA B/L and unlabored GI: BS positive and NT ND Extremity:  Edema Left thigh, Skin- suprapubic and left inguinal incisions CDI, looking great Neuro: Alert/Oriented, Normal Sensory and Abnormal Motor trace left hip flexio and 3- knee ext (pain limited), 3/5 L ADF/PF, 5/5 in BUE and RLE Musc/Skel:  Swelling Left thigh Gen NAD   Assessment/Plan: 1. Functional deficits secondary to Left acetabular fracture TDWB x 8wks which require 3+ hours per day of interdisciplinary therapy in a comprehensive inpatient rehab setting. Physiatrist is providing close team supervision and 24 hour management of active medical problems listed below. Physiatrist and rehab team continue to assess barriers to discharge/monitor patient progress toward functional and medical goals.  FIM: FIM -  Bathing Bathing Steps Patient Completed: Chest, Right Arm, Left Arm, Abdomen, Front perineal area, Buttocks, Right upper leg, Left upper leg, Right lower leg (including foot), Left lower leg (including foot) Bathing: 4: Steadying assist  FIM - Upper Body Dressing/Undressing Upper body dressing/undressing steps patient completed: Put head through opening of pull over shirt/dress, Thread/unthread left sleeve of pullover shirt/dress, Thread/unthread right sleeve of pullover shirt/dresss, Pull shirt over trunk Upper body dressing/undressing: 5: Supervision: Safety issues/verbal cues FIM - Lower Body Dressing/Undressing Lower body dressing/undressing steps patient completed: Thread/unthread right pants leg, Pull pants up/down, Don/Doff right sock Lower body dressing/undressing: 3: Mod-Patient completed 50-74% of tasks  FIM - Toileting Toileting steps completed by patient: Adjust clothing prior to toileting, Performs perineal hygiene Toileting Assistive Devices: Grab bar or rail for support Toileting: 3: Mod-Patient completed 2 of 3 steps  FIM - Diplomatic Services operational officerToilet Transfers Toilet Transfers Assistive Devices: Grab bars Toilet Transfers: 4-To toilet/BSC: Min A (steadying Pt. > 75%), 4-From toilet/BSC: Min A (steadying Pt. > 75%)  FIM - Bed/Chair Transfer Bed/Chair Transfer Assistive Devices: Walker, Arm rests Bed/Chair Transfer: 4: Bed > Chair or W/C: Min A (steadying Pt. > 75%)  FIM - Locomotion: Wheelchair Distance: 300 Locomotion: Wheelchair: 5: Travels 150 ft or more: maneuvers on rugs and over door sills with supervision, cueing or coaxing FIM - Locomotion: Ambulation Locomotion: Ambulation Assistive Devices: Designer, industrial/productWalker - Rolling Ambulation/Gait Assistance: 4: Min guard Locomotion: Ambulation: 1: Travels less than 50 ft with minimal assistance (Pt.>75%)  Comprehension Comprehension Mode: Auditory Comprehension: 7-Follows complex conversation/direction: With no assist  Expression Expression Mode:  Verbal Expression: 7-Expresses complex ideas: With no assist  Social  Interaction Social Interaction: 7-Interacts appropriately with others - No medications needed.  Problem Solving Problem Solving: 6-Solves complex problems: With extra time  Memory Memory: 6-More than reasonable amt of time  Medical Problem List and Plan: 1. Functional deficits secondary to Left acetabular fracture--TTWB X 8 weeks.  2. DVT Prophylaxis/Anticoagulation: Pharmaceutical: Lovenox D# 11/ 21 followed by ASA 325 mg bid.  3. Pain Management:MS contin decrease to BID . Continue oxycodone 15 mg prn.  4. Mood: LCSW to follow for evaluation and support.  5. Neuropsych: This patient is capable of making decisions on his own behalf. 6. Skin/Wound Care: routine pressure relief measures.  7. Fluids/Electrolytes/Nutrition: Monitor I/O. Offer supplements as needed. Check lytes in am. 8. ABLA: Will continue to monitor. Recheck CBC in am.  9. Scrotal trauma/edema: Local treatment with elevation and ice--limited to 20 minutes as a time. .  10. Polysubstance abuse: Patient doesn't perceive any problems with his substance use 11.  Neck and back pain not severe, pt thinks it is related to his bed positioning 12. Post op ileus improving as mobility increased, advance diet to regular and monitor, may increase laxatives LOS (Days) 6 A FACE TO FACE EVALUATION WAS PERFORMED  [name] 08/25/2014, 7:43 AM

## 2014-08-25 NOTE — Progress Notes (Signed)
Physical Therapy Session Note  Patient Details  Name: [name] MRN: [id number] Date of Birth: 03/17/1976  Today's Date: 08/25/2014 PT Individual Time: 1100-1200, 1430-1500 PT Individual Time Calculation (min): 60 min , 30 min  Short Term Goals: Week 1:  PT Short Term Goal 1 (Week 1): =LTG's due to ELOS Skilled Therapeutic Interventions/Progress Updates:    Pt limited in car transfers by decreased ability to maintain TDWB status despite cueing. Pt defers education on weight bearing status and valsalva avoidance claims he is not doing these things. Pt does demonstrates improvement increasing ambulation distance in session. Pt would continue to benefit from skilled PT services to increase functional mobility.  Therapy Documentation Precautions:  Precautions Precautions: Fall Precaution Comments: has strain in R shoulder from trapeze use Restrictions Weight Bearing Restrictions: Yes LLE Weight Bearing: Touchdown weight bearing Other Position/Activity Restrictions: Needs continuous cues to maintain WB status Pain: Pain Assessment Pain Assessment: 0-10 Pain Score: 2  Pain Type: Acute pain Pain Location: Pelvis Pain Orientation: Left Pain Descriptors / Indicators: Aching Pain Onset: On-going Pain Intervention(s): Medication (See eMAR) Mobility:  Pt performs transfers SBA with cues for weight bearing, safety, and technique Locomotion : Ambulation Ambulation/Gait Assistance: 4: Min guard 50'x1, 30'x1 and 20'x1 with cues for weight shift in am, 25' and 15'x1 in pm Wheelchair Mobility Distance: 150'x3   Other Treatments:    Tx1: Pt educated on rehab plan, safety in mobility, valsalva avoidance, and graded activity. Pt performs hip abd/add isometrics, HS isometrics, marching AAROM, LAQs with ventilation 2x10. Pt performs dynamic sitting balance with dual motor tasks including reaching, grasping, and weight shifting. Pt performs car transfers with cues for weight bearing. Pt performs  transfers x10 in session. Pt performs static standing 1'x3.  Tx 2: Pt educated on rehab plan, safety in mobility, and weight bearing status. Supine therex with qaud sets, hip abd AROM 2x10. Pt defers stair training. Pt performs long sit position x2'. Pt performs  Transfers x3 in session. Pt performs static standing 1'x2.   See FIM for current functional status  Therapy/Group: Individual Therapy  [name], [name] 08/25/2014, 12:34 PM

## 2014-08-25 NOTE — Progress Notes (Signed)
Occupational Therapy Session Note  Patient Details  Name: [name] MRN: [id number] Date of Birth: 10-17-1975  Today's Date: 08/25/2014 OT Individual Time:  - 9-10 Total minutes=60     Skilled Therapeutic Interventions/Progress Updates: ADL in w/c at sink & patient in much pain and only washed & dressed upper body.  RN had given pain already and brought in another.  He c/o of 9/10 pain in pelvis & left knee.  Patient also completed w/c to bed transfer with close supervision w/c level.        Therapy Documentation Precautions:  Precautions Precautions: Fall Precaution Comments: has strain in R shoulder from trapeze use Restrictions Weight Bearing Restrictions: Yes LLE Weight Bearing: Touchdown weight bearing Other Position/Activity Restrictions: Needs continuous cues to maintain WB status  Pain:8/10 left pelvis & knee and right pelvis .  RN gave pain meds  See FIM for current functional status  Therapy/Group: Individual Therapy  [name], [name] 08/25/2014, 3:02 PM

## 2014-08-26 ENCOUNTER — Inpatient Hospital Stay (HOSPITAL_COMMUNITY): Payer: PRIVATE HEALTH INSURANCE

## 2014-08-26 ENCOUNTER — Inpatient Hospital Stay (HOSPITAL_COMMUNITY): Payer: BLUE CROSS/BLUE SHIELD | Admitting: Rehabilitation

## 2014-08-26 ENCOUNTER — Inpatient Hospital Stay (HOSPITAL_COMMUNITY): Payer: PRIVATE HEALTH INSURANCE | Admitting: Occupational Therapy

## 2014-08-26 ENCOUNTER — Inpatient Hospital Stay (HOSPITAL_COMMUNITY): Payer: BLUE CROSS/BLUE SHIELD

## 2014-08-26 DIAGNOSIS — M25562 Pain in left knee: Secondary | ICD-10-CM

## 2014-08-26 MED ORDER — MORPHINE SULFATE ER 15 MG PO TBCR
15.0000 mg | EXTENDED_RELEASE_TABLET | Freq: Two times a day (BID) | ORAL | Status: DC
  Administered 2014-08-26 – 2014-08-28 (×5): 15 mg via ORAL
  Filled 2014-08-26 (×5): qty 1

## 2014-08-26 NOTE — Progress Notes (Signed)
Occupational Therapy Session Note  Patient Details  Name: Oralia RudSteven L Thul MRN: [id number] Date of Birth: April 09, 1976  Today's Date: 08/26/2014 OT Individual Time: 1100-1200 OT Individual Time Calculation (min): 60 min    Short Term Goals: Week 1:  OT Short Term Goal 1 (Week 1): STGs equal to LTGs based on ELOS.  Skilled Therapeutic Interventions/Progress Updates:    Pt resting in w/c upon arrival and declined bathing and dressing this morning, stating he had already "taken care of that." Pt propelled to ADL apartment and practiced w/c<>bed transfers and bed mobility. Pt requires wedge at back for comfort.  Pt transitioned to tub room to practice TTB transfers.  Pt amb in ADL apartment to access bed and into bathroom to access tub.  Pt performed all tasks at supervision level.  Pt requires more than reasonable amount of time to complete all tasks.  Pt does not exhibit any unsafe behaviors although question pt's ability to maintain LLE TDWB.  Pt states he is not putting any weight through LLE.  Therapy Documentation Precautions:  Precautions Precautions: Fall Precaution Comments: has strain in R shoulder from trapeze use Restrictions Weight Bearing Restrictions: Yes LLE Weight Bearing: Touchdown weight bearing Other Position/Activity Restrictions: Needs continuous cues to maintain WB status Pain: Pain Assessment Pain Assessment: No/denies pain Pain Score: 6  Pain Type: Acute pain Pain Location: Hip Pain Orientation: Right;Left Pain Descriptors / Indicators: Aching Pain Onset: On-going  See FIM for current functional status  Therapy/Group: Individual Therapy  Rich BraveLanier, Breeonna Mone Chappell 08/26/2014, 12:10 PM

## 2014-08-26 NOTE — Progress Notes (Signed)
Recreational Therapy Session Note  Patient Details  Name: [name] MRN: [id number] Date of Birth: 1976/04/11 Today's Date: 08/26/2014  Pain: 6/10 left hip, premedicated Skilled Therapeutic Interventions/Progress Updates: Session focused on discussion of community reintegration/outing including purpose & potential goals.  Pt agreeable to participate, requesting lunch outing to [employer]  Tyria Springer 08/26/2014, 4:10 PM

## 2014-08-26 NOTE — Progress Notes (Addendum)
Physical Therapy Session Note  Patient Details  Name: [name] MRN: [id number] Date of Birth: 05/29/1975  Today's Date: 08/26/2014 PT Individual Time: 0930-1030 PT Individual Time Calculation (min): 60 min   Short Term Goals: Week 1:  PT Short Term Goal 1 (Week 1): =LTG's due to ELOS  Skilled Therapeutic Interventions/Progress Updates:   Skilled session focused on gait training with use of RW for improved quality and distance, stair negotiation to simulate home entry, bed mobility with wedge to elevate HOB, and supine therex (on wedge) for BLE strengthening and LLE ROM.  Performed gait x 40' x 1 and another 25' x 1 with RW at S level with continued cues for posture and technique.  Note marked improvement in advancing LLE during gait today vs previous sessions, but continues to have VERY slow gait speed.  Pt able to get in/out of "bed" (therapy mat) at S level with use of leg lifter today with min cues for technique and safety.  While in supine, performed exercises as follows; quad sets x 10 reps BLE, glute sets x 10 reps, L SAQs x 10 reps, R SLR x 10 reps, active assisted L heel slides x 10 reps and L hip abd x 10 reps.  Ended session with stair negotiation x 2 steps with use of RW at min A level and cues for sequencing and technique.  Tolerated well today but with increased pain due to xray prior to session.  Pt propelled back to room and left in w/c with all needs in reach, ice pack for pain control.    RT present during session to discuss community outing tomorrow to [employer] for lunch and goals of outing.  Pt agreeable and verbalized understanding.   Therapy Documentation Precautions:  Precautions Precautions: Fall Precaution Comments: has strain in R shoulder from trapeze use Restrictions Weight Bearing Restrictions: Yes LLE Weight Bearing: Touchdown weight bearing Other Position/Activity Restrictions: Needs continuous cues to maintain WB status   Vital Signs: Therapy Vitals Temp:  98.3 F (36.8 C) Temp Source: Oral Pulse Rate: 100 Resp: 18 BP: 111/63 mmHg Patient Position (if appropriate): Lying Oxygen Therapy SpO2: 97 % O2 Device: Not Delivered Pain: Pain Assessment Pain Assessment: 0-10 Pain Score: 6  Faces Pain Scale: Hurts a little bit Pain Type: Surgical pain Pain Location: Hip Pain Orientation: Left Pain Descriptors / Indicators: Aching Pain Frequency: Constant Pain Onset: On-going Pain Intervention(s): Medication (See eMAR)     See FIM for current functional status  Therapy/Group: Individual Therapy  Vista Deckarcell, [name] 08/26/2014, 8:30 AM

## 2014-08-26 NOTE — Progress Notes (Signed)
Service response called RN to ask if pt needed a meal tray in the AM, and said that there was a note to not ask the pt what he wants to eat, and that he didn't want a meal from the hospital. RN confirmed with the pt that he does not want a tray delivered. He said he has people bringing him all of his meals. Will pass on to day RN. Rudie MeyerEurillo, Armonee Bojanowski A, RN

## 2014-08-26 NOTE — Plan of Care (Signed)
Problem: RH Ambulation Goal: LTG Patient will ambulate in controlled environment (PT) LTG: Patient will ambulate in a controlled environment, # of feet with assistance (PT).  Downgraded due to low activity tolerance and pain

## 2014-08-26 NOTE — Progress Notes (Signed)
Occupational Therapy Session Note  Patient Details  Name: Oralia RudSteven L Haque MRN: [id number] Date of Birth: May 13, 1975  Today's Date: 08/26/2014 OT Individual Time: [id number] OT Individual Time Calculation (min): 72 min    Short Term Goals: Week 1:  OT Short Term Goal 1 (Week 1): STGs equal to LTGs based on ELOS.  Skilled Therapeutic Interventions/Progress Updates:    Pt performed bathing and dressing during session.  Close supervision for ambulation to the shower with use of the RW.  Pt still with decreased ability to advance the LLE but is able to follow TDWBing status more efficiently.   Utilized reacher and LH sponge during session for bathing and drying off the LLE.  Sockaide also integrated for donning the left sock as well.  Pt still with increased pain and is heavily dependent on medications to complete therapy sessions.    Therapy Documentation Precautions:  Precautions Precautions: Fall Precaution Comments: has strain in R shoulder from trapeze use Restrictions Weight Bearing Restrictions: Yes LLE Weight Bearing: Touchdown weight bearing Other Position/Activity Restrictions: Needs continuous cues to maintain WB status  Pain: Pain Assessment Pain Assessment: 0-10 Pain Score: 4  Faces Pain Scale: Hurts a little bit Pain Type: Acute pain Pain Location: Hip Pain Intervention(s): Medication (See eMAR);Repositioned ADL: See FIM for current functional status  Therapy/Group: Individual Therapy  Pyper Olexa OTR/L 08/26/2014, 4:48 PM

## 2014-08-26 NOTE — Progress Notes (Signed)
39 y.o. male who was admitted on 08/11/14 after being thrown off his horse which subsequently landed on top of him. UDS positive for cocaine and THC. Patient was found to have severely comminuted both column acetabulum fracture and was placed in traction by Dr. August Saucerean. He was evaluated by Dr [name] and underwent ORIF left both column acetabular fracture with removal of tibial pin. Post op TDWB LLE X 12 weeks with recommendations for lovenox X 20 days followed by ASA bid. ABLA treated with 2 units PRBC. Patient has had issues with scrotal pain as well as abdominal discomfort due to ileus and was started on bowel program without results? He had had recent tooth abscess treated with clindamycin. Subjective/Complaints:  Pain contol ok when he keeps up with oxy Objective: Vital Signs: Blood pressure 111/63, pulse 100, temperature 98.3 F (36.8 C), temperature source Oral, resp. rate 18, weight 97.5 kg (214 lb 15.2 oz), SpO2 97 %. No results found. No results found for this or any previous visit (from the past 72 hour(s)).    Neuro: Alert/Oriented, Normal Sensory and Abnormal Motor trace left hip flexio and 3- knee ext (pain limited), 3/5 L ADF/PF, 5/5 in BUE and RLE Musc/Skel:  Swelling Left thigh, medial ecchymosis Gen NAD   Assessment/Plan: 1. Functional deficits secondary to Left acetabular fracture TDWB x 8wks which require 3+ hours per day of interdisciplinary therapy in a comprehensive inpatient rehab setting. Physiatrist is providing close team supervision and 24 hour management of active medical problems listed below. Physiatrist and rehab team continue to assess barriers to discharge/monitor patient progress toward functional and medical goals.  FIM: FIM - Bathing Bathing Steps Patient Completed: Chest, Right Arm, Left Arm, Abdomen Bathing: 5: Supervision: Safety issues/verbal cues (was too painful to wash any further)  FIM - Upper Body Dressing/Undressing Upper body  dressing/undressing steps patient completed: Thread/unthread right sleeve of pullover shirt/dresss, Pull shirt over trunk, Thread/unthread left sleeve of pullover shirt/dress, Put head through opening of pull over shirt/dress Upper body dressing/undressing: 5: Set-up assist to: Obtain clothing/put away FIM - Lower Body Dressing/Undressing Lower body dressing/undressing steps patient completed: Thread/unthread right pants leg, Pull pants up/down, Don/Doff right sock Lower body dressing/undressing: 0: Activity did not occur  FIM - Toileting Toileting steps completed by patient: Adjust clothing prior to toileting, Performs perineal hygiene Toileting Assistive Devices: Grab bar or rail for support Toileting: 0: Activity did not occur  FIM - Diplomatic Services operational officerToilet Transfers Toilet Transfers Assistive Devices: Therapist, musicGrab bars Toilet Transfers: 0-Activity did not occur  FIM - BankerBed/Chair Transfer Bed/Chair Transfer Assistive Devices: Environmental consultantWalker, Arm rests Bed/Chair Transfer: 5: Bed > Chair or W/C: Supervision (verbal cues/safety issues), 5: Chair or W/C > Bed: Supervision (verbal cues/safety issues), 5: Sit > Supine: Supervision (verbal cues/safety issues), 5: Supine > Sit: Supervision (verbal cues/safety issues)  FIM - Locomotion: Wheelchair Distance: 150'x3 Locomotion: Wheelchair: 5: Travels 150 ft or more: maneuvers on rugs and over door sills with supervision, cueing or coaxing FIM - Locomotion: Ambulation Locomotion: Ambulation Assistive Devices: Designer, industrial/productWalker - Rolling Ambulation/Gait Assistance: 4: Min guard Locomotion: Ambulation: 2: Travels 50 - 149 ft with minimal assistance (Pt.>75%)  Comprehension Comprehension Mode: Auditory Comprehension: 7-Follows complex conversation/direction: With no assist  Expression Expression Mode: Verbal Expression: 7-Expresses complex ideas: With no assist  Social Interaction Social Interaction: 6-Interacts appropriately with others with medication or extra time (anti-anxiety,  antidepressant).  Problem Solving Problem Solving: 5-Solves complex 90% of the time/cues < 10% of the time  Memory Memory: 6-More than reasonable amt of  time  Medical Problem List and Plan: 1. Functional deficits secondary to Left acetabular fracture--TTWB X 8 weeks.  2. DVT Prophylaxis/Anticoagulation: Pharmaceutical: Lovenox D# 12/ 21 followed by ASA 325 mg bid.  3. Pain Management:MS contin decrease to 15mg  BID . Continue oxycodone 15 mg prn.  4. Mood: LCSW to follow for evaluation and support.  5. Neuropsych: This patient is capable of making decisions on his own behalf. 6. Skin/Wound Care: routine pressure relief measures.  7. Fluids/Electrolytes/Nutrition: Monitor I/O. Offer supplements as needed. Check lytes in am. 8. ABLA: Will continue to monitor. Recheck CBC in am.  9. Scrotal trauma/edema: Local treatment with elevation and ice--limited to 20 minutes as a time. .  10. Polysubstance abuse: Patient doesn't perceive any problems with his substance use 11.  Neck and back pain not severe, pt thinks it is related to his bed positioning 12. Post op ileus resolved LOS (Days) 7 A FACE TO FACE EVALUATION WAS PERFORMED  KIRSTEINS,ANDREW E 08/26/2014, 7:43 AM

## 2014-08-27 ENCOUNTER — Encounter (HOSPITAL_COMMUNITY): Payer: PRIVATE HEALTH INSURANCE

## 2014-08-27 ENCOUNTER — Inpatient Hospital Stay (HOSPITAL_COMMUNITY): Payer: PRIVATE HEALTH INSURANCE | Admitting: Rehabilitation

## 2014-08-27 ENCOUNTER — Inpatient Hospital Stay (HOSPITAL_COMMUNITY): Payer: BLUE CROSS/BLUE SHIELD | Admitting: Rehabilitation

## 2014-08-27 DIAGNOSIS — T814XXA Infection following a procedure, initial encounter: Secondary | ICD-10-CM

## 2014-08-27 MED ORDER — DOXYCYCLINE HYCLATE 100 MG PO TABS
100.0000 mg | ORAL_TABLET | Freq: Two times a day (BID) | ORAL | Status: DC
  Administered 2014-08-27 – 2014-08-28 (×3): 100 mg via ORAL
  Filled 2014-08-27 (×6): qty 1

## 2014-08-27 NOTE — Progress Notes (Signed)
Physical Therapy Session Note  Patient Details  Name: [name] MRN: [id number] Date of Birth: Aug 05, 1975  Today's Date: 08/27/2014 PT Concurrent Time: 1045-1245 PT Concurrent Time Calculation (min): 120 min  Short Term Goals: Week 1:  PT Short Term Goal 1 (Week 1): =LTG's due to ELOS  Skilled Therapeutic Interventions/Progress Updates:   Skilled session focused on community integration with outing to [employer] caf[employer].  Performed most community mobility at w/c level at mod I level.  Pt able to negotiate up/down inclines, manage w/c parts and manage tray in line at mod I level with good problem solving to ask waitress for assist with tray.  Had pt ambulate in/out of restroom at S level with min cues for safety.  Also short distance ambulation outside and up/down curb step with RW at S level.  Educated on energy conservation, safety with community integration and barriers to community re-entry with min A cues for problem solving through difficulties in community.  Pt propelled in controlled and outdoor environment >500' at mod I level.  See goal sheet for further details on mobility.  Pt assisted back to room and left with all needs in reach.   Therapy Documentation Precautions:  Precautions Precautions: Fall Precaution Comments: has strain in R shoulder from trapeze use Restrictions Weight Bearing Restrictions: Yes LLE Weight Bearing: Touchdown weight bearing Other Position/Activity Restrictions: Needs continuous cues to maintain WB status   Pain: Pain Assessment Pain Assessment: 0-10 Pain Score: 2  Pain Intervention(s): Medication (See eMAR)  See FIM for current functional status  Therapy/Group: Co-Treatment w/ RT  [name], Meribeth MattesEmily [name] 08/27/2014, 1:40 PM

## 2014-08-27 NOTE — Progress Notes (Signed)
Recreational Therapy Session Note  Patient Details  Name: [name] MRN: [id number] Date of Birth: 09/04/1975 Today's Date: 08/27/2014  Pain: c/o 2/10 hip pain, premedicated Skilled Therapeutic Interventions/Progress Updates: Pt participated in community reintegration/outing to [employer] at [employer] [place] w/c level and supervision short distance ambulation using RW.  Goals focused on safe functional mobility on various indoor and outdoor surface types, identification and negotiation of obstacles, & accessing public restroom.  See outing goal sheet for full details.  Therapy/Group: [employer] Reintegration   Aamna Mallozzi 08/27/2014, 3:38 PM

## 2014-08-27 NOTE — Progress Notes (Signed)
Physical Therapy Discharge Summary  Patient Details  Name: [name] MRN: [id number] Date of Birth: 1976-02-15  Today's Date: 08/27/2014 PT Individual Time: 1510-1550 PT Individual Time Calculation (min): 40 min    Patient has met 9 of 9 long term goals due to improved activity tolerance, improved balance, increased strength, increased range of motion, decreased pain and ability to compensate for deficits.  Patient to discharge at an ambulatory level Modified Independent for short distances and w/c level at mod I for longer distances.  Pt will have intermittent assist at home from mother and sister.   Reasons goals not met: n/a  Recommendation:  Patient will benefit from ongoing skilled PT services in home health setting to continue to advance safe functional mobility, address ongoing impairments in decreased endurance, decreased strength in LLE, decreased ROM, and minimize fall risk.  Equipment: w/c, RW, cushion  Reasons for discharge: treatment goals met and discharge from hospital  Patient/family agrees with progress made and goals achieved: Yes   PT Treatment/intervention: Skilled session focused on grad day activities and ensuring pt met all goals for safe D/C.  Performed all bed mobility, transfers, and gait with RW and use of leg lifter at mod I level.  Performed stairs at S level with set up for RW and S for car transfers with use of leg lifter.  Performed gait in controlled and home environment x 50' x 1 and another 25' x 1 with RW at mod I level.  Performed w/c mobility in home, controlled and community environment (see previous note) at mod I level as well.  Pt left in room with all needs in reach, made mod I in room, RN made aware.    PT Discharge Precautions/Restrictions Precautions Precautions: Fall Precaution Comments: has strain in R shoulder from trapeze use Restrictions Weight Bearing Restrictions: Yes LLE Weight Bearing: Touchdown weight bearing Vital  Signs Therapy Vitals Temp: 98.4 F (36.9 C) Temp Source: Oral Pulse Rate: 88 Resp: 18 BP: 112/78 mmHg Patient Position (if appropriate): Sitting Oxygen Therapy SpO2: 97 % O2 Device: Not Delivered Pain Pain Assessment Pain Assessment: 0-10 Pain Score: 4  Pain Type: Surgical pain;Acute pain Pain Location: Hip Pain Orientation: Left Pain Descriptors / Indicators: Aching Pain Intervention(s): Repositioned;Rest    Cognition Overall Cognitive Status: Within Functional Limits for tasks assessed Arousal/Alertness: Awake/alert Orientation Level: Oriented X4 Attention: Alternating Alternating Attention: Appears intact Memory: Appears intact Awareness: Appears intact Safety/Judgment: Appears intact Sensation Sensation Light Touch: Appears Intact Stereognosis: Not tested Hot/Cold: Not tested Proprioception: Appears Intact Coordination Gross Motor Movements are Fluid and Coordinated: Yes Fine Motor Movements are Fluid and Coordinated: Yes Coordination and Movement Description: Pt with limited coordination in LEs due to pain and strength deficits Motor  Motor Motor: Within Functional Limits Motor - Discharge Observations: decreased strength in LLE  Mobility Bed Mobility Bed Mobility: Supine to Sit;Sit to Supine Supine to Sit: 6: Modified independent (Device/Increase time) Sit to Supine: 6: Modified independent (Device/Increase time) Transfers Transfers: Yes Sit to Stand: 6: Modified independent (Device/Increase time) Stand to Sit: 6: Modified independent (Device/Increase time) Stand Pivot Transfers: 6: Modified independent (Device/Increase time) Locomotion  Ambulation Ambulation: Yes Ambulation/Gait Assistance: 6: Modified independent (Device/Increase time) Ambulation Distance (Feet): 52 Feet Assistive device: Rolling walker Gait Gait: Yes Gait Pattern: Impaired Gait Pattern: Step-to pattern;Decreased step length - left;Decreased stance time - left;Decreased hip/knee  flexion - left;Trunk flexed;Antalgic Stairs / Additional Locomotion Stairs: Yes Stairs Assistance: 5: Supervision Stairs Assistance Details: Verbal cues for sequencing;Verbal cues for  technique;Verbal cues for precautions/safety Stair Management Technique: No rails;Step to pattern;Backwards;Forwards;With walker Number of Stairs: 2 Height of Stairs: 6 Wheelchair Mobility Wheelchair Mobility: Yes Wheelchair Assistance: 6: Modified independent (Device/Increase time) Environmental health practitioner: Both upper extremities Wheelchair Parts Management: Independent Distance: 200  Trunk/Postural Assessment  Cervical Assessment Cervical Assessment: Within Functional Limits Thoracic Assessment Thoracic Assessment: Within Functional Limits Lumbar Assessment Lumbar Assessment: Within Functional Limits Postural Control Postural Control: Within Functional Limits  Balance Balance Balance Assessed: Yes Static Sitting Balance Static Sitting - Balance Support: Feet supported Static Sitting - Level of Assistance: 6: Modified independent (Device/Increase time) Dynamic Sitting Balance Dynamic Sitting - Balance Support: Feet supported;During functional activity Dynamic Sitting - Level of Assistance: 6: Modified independent (Device/Increase time) Static Standing Balance Static Standing - Balance Support: During functional activity Static Standing - Level of Assistance: 6: Modified independent (Device/Increase time) Dynamic Standing Balance Dynamic Standing - Balance Support: During functional activity Dynamic Standing - Level of Assistance: 6: Modified independent (Device/Increase time) Extremity Assessment      RLE Assessment RLE Assessment: Within Functional Limits LLE Assessment LLE Assessment: Exceptions to [facility] LLE Strength LLE Overall Strength: Deficits;Due to pain;Due to precautions LLE Overall Strength Comments: hip flex 2/5, hip ext 3/5, knee ext 3/5, knee flex 3/5, ankle motions 4/5  See FIM  for current functional status  Denice Bors 08/27/2014, 4:28 PM

## 2014-08-27 NOTE — Progress Notes (Signed)
Social Work Patient ID: [name], male   DOB: Sep 13, 1975, 39 y.o.   MRN: [id number] Team recommends equipment and follow up home health, pt agreeable to their recommendations.  [facility], rolling walker and tub bench to be delivered To pt's room today or early tomorrow am.  [facility] & [facility].  Pt going on outing today looking forward to getting out of the hospital for a few hours.

## 2014-08-27 NOTE — Progress Notes (Addendum)
Occupational Therapy Session Note  Patient Details  Name: [name] MRN: [id number] Date of Birth: 10-27-1975  Today's Date: 08/27/2014 OT Individual Time: 0800-0900 OT Individual Time Calculation (min): 60 min    Short Term Goals: Week 1:  OT Short Term Goal 1 (Week 1): STGs equal to LTGs based on ELOS.  Skilled Therapeutic Interventions/Progress Updates:    Pt engaged in BADLs with focus on AE use to increase independence, standing balance, safety awareness, functional amb with RW, and activity tolerance.  Pt provided with Ted hose for LLE.  Pt utilized reacher, sock aid, and shoe horn to assist with LB dressing and sponge to assist with LB bathing.  Pt performed all tasks at mod I. Pt engaged in simple meal prep in kitchen at w/c and ambulatory level - mod I.  Pt required extra time to complete all tasks but did not exhibit any unsafe behaviors.  Therapy Documentation Precautions:  Precautions Precautions: Fall Precaution Comments: has strain in R shoulder from trapeze use Restrictions Weight Bearing Restrictions: Yes LLE Weight Bearing: Touchdown weight bearing Other Position/Activity Restrictions: Needs continuous cues to maintain WB status Pain: Pain Assessment Pain Score: 6  Pain Type: Acute pain Pain Location: Pelvis Pain Orientation: Left Pain Descriptors / Indicators: Aching Pain Onset: On-going Pain Intervention(s): Repositioned;RN made aware;Cold applied  See FIM for current functional status  Therapy/Group: Individual Therapy  [name], [name] 08/27/2014, 9:21 AM

## 2014-08-27 NOTE — Progress Notes (Signed)
Occupational Therapy Discharge Summary  Patient Details  Name: [name] MRN: [id number] Date of Birth: 1975-09-28   Patient has met 11 of 11 long term goals due to improved activity tolerance, improved balance and ability to compensate for deficits.  Pt made steady progress with BADLs and IADLs during this admission.  Pt utilized AE appropriately to assist with BADLs and is mod I for bathing and dressing tasks.  Pt is mod I for simple meal prep at w/c level and ambulatory level.  Pt requires more than a reasonable amount of time to complete tasks with multiple rest breaks.  No family has been present for therapy sessions. Patient to discharge at overall Modified Independent level.  Patient's care partner unavailable to provide the necessary physical assistance at discharge.      Recommendation:  No further OT follow-up recommended at this time.  Pt understands use of AE and DME as well as adaptive techniques for selfcare tasks.  He will have PRN supervision from his mother and he is able to self direct if needed.   Equipment: Tub Producer, television/film/video  Reasons for discharge: treatment goals met and discharge from hospital  Patient/family agrees with progress made and goals achieved: Yes  OT Discharge   Vision/Perception  Vision- History Baseline Vision/History: No visual deficits Patient Visual Report: No change from baseline Vision- Assessment Vision Assessment?: No apparent visual deficits  Cognition Overall Cognitive Status: Within Functional Limits for tasks assessed Arousal/Alertness: Awake/alert Orientation Level: Oriented X4 Attention: Alternating Alternating Attention: Appears intact Memory: Appears intact Awareness: Appears intact Safety/Judgment: Appears intact Sensation Sensation Light Touch: Appears Intact Stereognosis: Appears Intact Hot/Cold: Appears Intact Proprioception: Appears Intact Motor  Motor Motor: Within Functional Limits    Trunk/Postural  Assessment  Cervical Assessment Cervical Assessment: Within Functional Limits Thoracic Assessment Thoracic Assessment: Within Functional Limits Lumbar Assessment Lumbar Assessment: Within Functional Limits  Balance Static Sitting Balance Static Sitting - Balance Support: Feet supported Static Sitting - Level of Assistance: 6: Modified independent (Device/Increase time) Dynamic Sitting Balance Dynamic Sitting - Balance Support: Feet supported;During functional activity Dynamic Sitting - Level of Assistance: 6: Modified independent (Device/Increase time) Extremity/Trunk Assessment RUE Assessment RUE Assessment: Within Functional Limits LUE Assessment LUE Assessment: Within Functional Limits  See FIM for current functional status  Leroy Libman 08/27/2014, 12:24 PM   Clyda Greener, OTR/L 08/27/2014

## 2014-08-27 NOTE — Progress Notes (Signed)
39 y.o. male who was admitted on 08/11/14 after being thrown off his horse which subsequently landed on top of him. UDS positive for cocaine and THC. Patient was found to have severely comminuted both column acetabulum fracture and was placed in traction by Dr. August Saucerean. He was evaluated by Dr [name] and underwent ORIF left both column acetabular fracture with removal of tibial pin. Post op TDWB LLE X 12 weeks with recommendations for lovenox X 20 days followed by ASA bid. ABLA treated with 2 units PRBC. Patient has had issues with scrotal pain as well as abdominal discomfort due to ileus and was started on bowel program without results? He had had recent tooth abscess treated with clindamycin. Subjective/Complaints:  Noted some drainage from Left hip wound Objective: Vital Signs: Blood pressure 107/72, pulse 88, temperature 98.2 F (36.8 C), temperature source Oral, resp. rate 19, weight 97.5 kg (214 lb 15.2 oz), SpO2 90 %. Dg Knee 4 Views W/patella Left  08/26/2014   CLINICAL DATA:  Left knee pain.  Initial evaluation  EXAM: LEFT KNEE - COMPLETE 4+ VIEW  COMPARISON:  None.  FINDINGS: Tiny to be corticated small lucency is noted the proximal left tibia, this is most likely a prominent nutrient foramen. No evidence fracture or dislocation. No evidence of significant effusion.  IMPRESSION: No acute abnormality.   Electronically Signed   By: Maisie Fushomas  Register   On: 08/26/2014 08:43   No results found for this or any previous visit (from the past 72 hour(s)).   Lungs clear Cor RRR Abd soft NT Neuro: Alert/Oriented, Normal Sensory and Abnormal Motor trace left hip flexio and 3- knee ext (pain limited), 3/5 L ADF/PF, 5/5 in BUE and RLE Musc/Skel:  Swelling Left thigh, medial ecchymosis Skin- Left hip sup aspect of incision with mild erythema, sm amt drainage Gen NAD   Assessment/Plan: 1. Functional deficits secondary to Left acetabular fracture TDWB x 8wks which require 3+ hours per day of  interdisciplinary therapy in a comprehensive inpatient rehab setting. Physiatrist is providing close team supervision and 24 hour management of active medical problems listed below. Physiatrist and rehab team continue to assess barriers to discharge/monitor patient progress toward functional and medical goals.  FIM: FIM - Bathing Bathing Steps Patient Completed: Chest, Right Arm, Left Arm, Abdomen, Front perineal area, Buttocks, Right upper leg, Left upper leg, Right lower leg (including foot), Left lower leg (including foot) Bathing: 5: Supervision: Safety issues/verbal cues  FIM - Upper Body Dressing/Undressing Upper body dressing/undressing steps patient completed: Thread/unthread right sleeve of pullover shirt/dresss, Pull shirt over trunk, Thread/unthread left sleeve of pullover shirt/dress, Put head through opening of pull over shirt/dress Upper body dressing/undressing: 5: Set-up assist to: Obtain clothing/put away FIM - Lower Body Dressing/Undressing Lower body dressing/undressing steps patient completed: Thread/unthread right pants leg, Pull pants up/down, Don/Doff right sock, Don/Doff left sock, Thread/unthread left pants leg Lower body dressing/undressing: 5: Supervision: Safety issues/verbal cues  FIM - Toileting Toileting steps completed by patient: Adjust clothing prior to toileting, Performs perineal hygiene Toileting Assistive Devices: Grab bar or rail for support Toileting: 3: Mod-Patient completed 2 of 3 steps  FIM - Diplomatic Services operational officerToilet Transfers Toilet Transfers Assistive Devices: Therapist, musicGrab bars Toilet Transfers: 0-Activity did not occur  FIM - BankerBed/Chair Transfer Bed/Chair Transfer Assistive Devices: Environmental consultantWalker, Arm rests Bed/Chair Transfer: 5: Bed > Chair or W/C: Supervision (verbal cues/safety issues), 5: Chair or W/C > Bed: Supervision (verbal cues/safety issues), 5: Sit > Supine: Supervision (verbal cues/safety issues), 5: Supine > Sit: Supervision (verbal cues/safety  issues)  FIM -  Locomotion: Wheelchair Distance: 100 Locomotion: Wheelchair: 2: Travels 50 - 149 ft with supervision, cueing or coaxing FIM - Locomotion: Ambulation Locomotion: Ambulation Assistive Devices: Designer, industrial/productWalker - Rolling Ambulation/Gait Assistance: 5: Supervision Locomotion: Ambulation: 1: Travels less than 50 ft with supervision/safety issues  Comprehension Comprehension Mode: Auditory Comprehension: 7-Follows complex conversation/direction: With no assist  Expression Expression Mode: Verbal Expression: 7-Expresses complex ideas: With no assist  Social Interaction Social Interaction: 6-Interacts appropriately with others with medication or extra time (anti-anxiety, antidepressant).  Problem Solving Problem Solving: 5-Solves complex 90% of the time/cues < 10% of the time  Memory Memory: 6-More than reasonable amt of time  Medical Problem List and Plan: 1. Functional deficits secondary to Left acetabular fracture--TTWB X 8 weeks.  2. DVT Prophylaxis/Anticoagulation: Pharmaceutical: Lovenox D# 13/ 21 followed by ASA 325 mg bid.  3. Pain Management:MS contin decrease to 15mg  BID . Will D/C morphine in am   Continue oxycodone 15 mg prn.  4. Mood: LCSW to follow for evaluation and support.  5. Neuropsych: This patient is capable of making decisions on his own behalf. 6. Skin/Wound Care: start doxy for skin 7. Fluids/Electrolytes/Nutrition: Monitor I/O. Offer supplements as needed. Check lytes in am. 8. ABLA: Will continue to monitor. Recheck CBC in am.  9. Scrotal trauma/edema: Local treatment with elevation and ice--limited to 20 minutes as a time. .  10. Polysubstance abuse: Patient doesn't perceive any problems with his substance use 11.  Neck and back pain not severe, pt thinks it is related to his bed positioning 12. Post op ileus resolved LOS (Days) 8 A FACE TO FACE EVALUATION WAS PERFORMED  KIRSTEINS,ANDREW E 08/27/2014, 7:04 AM

## 2014-08-27 NOTE — Discharge Instructions (Signed)
Inpatient Rehab Discharge Instructions  Oralia RudSteven L Alf Discharge date and time:  08/28/14  Activities/Precautions/ Functional Status: Activity: activity as tolerated with touch down weight on Left leg.  Diet:  Regular Wound Care: keep wound clean and dry Functional status:  ___ No restrictions     ___ Walk up steps independently ___ 24/7 supervision/assistance   ___ Walk up steps with assistance ___ Intermittent supervision/assistance  ___ Bathe/dress independently ___ Walk with walker     ___ Bathe/dress with assistance ___ Walk Independently    ___ Shower independently ___ Walk with assistance    ___ Shower with assistance ___ No alcohol     ___ Return to work/school ________  Special Instructions:    COMMUNITY REFERRALS UPON DISCHARGE:    Home Health:   PT & RN   Agency:[facility] Phone:[phone]   Date of last service:08/28/2014  Medical Equipment/Items Ordered:WHEELCHAIR, Levan HurstROLLING WALKER & TUB BENCH  Agency/Supplier:[facility]    [phone]   My questions have been answered and I understand these instructions. I will adhere to these goals and the provided educational materials after my discharge from the hospital.  Patient/Caregiver Signature _______________________________ Date __________  Clinician Signature _______________________________________ Date __________  Please bring this form and your medication list with you to all your follow-up doctor's appointments.

## 2014-08-28 MED ORDER — DOXYCYCLINE HYCLATE 100 MG PO TABS: 100.0000 mg | ORAL_TABLET | Freq: Two times a day (BID) | ORAL | Status: AC

## 2014-08-28 MED ORDER — ASPIRIN 325 MG PO TBEC: 325.0000 mg | DELAYED_RELEASE_TABLET | Freq: Two times a day (BID) | ORAL | Status: AC

## 2014-08-28 MED ORDER — GABAPENTIN 300 MG PO CAPS: 300.0000 mg | ORAL_CAPSULE | Freq: Two times a day (BID) | ORAL | Status: AC

## 2014-08-28 MED ORDER — ASCORBIC ACID 500 MG PO TABS: 500.0000 mg | ORAL_TABLET | Freq: Two times a day (BID) | ORAL | Status: AC

## 2014-08-28 MED ORDER — CHOLECALCIFEROL 25 MCG (1000 UT) PO TABS: 1000.0000 [IU] | ORAL_TABLET | Freq: Two times a day (BID) | ORAL | Status: AC

## 2014-08-28 MED ORDER — POLYSACCHARIDE IRON COMPLEX 150 MG PO CAPS: 150.0000 mg | ORAL_CAPSULE | Freq: Every day | ORAL | Status: AC

## 2014-08-28 MED ORDER — OXYCODONE HCL 15 MG PO TABS: 15.0000 mg | ORAL_TABLET | ORAL | Status: AC | PRN

## 2014-08-28 MED ORDER — POLYETHYLENE GLYCOL 3350 17 G PO PACK: 17.0000 g | PACK | Freq: Two times a day (BID) | ORAL | Status: AC

## 2014-08-28 MED ORDER — METHOCARBAMOL 500 MG PO TABS: 1000.0000 mg | ORAL_TABLET | Freq: Four times a day (QID) | ORAL | Status: AC | PRN

## 2014-08-28 NOTE — Progress Notes (Signed)
39 y.o. male who was admitted on 08/11/14 after being thrown off his horse which subsequently landed on top of him. UDS positive for cocaine and THC. Patient was found to have severely comminuted both column acetabulum fracture and was placed in traction by Dr. August Saucerean. He was evaluated by Dr [name] and underwent ORIF left both column acetabular fracture with removal of tibial pin. Post op TDWB LLE X 12 weeks with recommendations for lovenox X 20 days followed by ASA bid. ABLA treated with 2 units PRBC. Patient has had issues with scrotal pain as well as abdominal discomfort due to ileus and was started on bowel program without results? He had had recent tooth abscess treated with clindamycin. Subjective/Complaints:  Noted some drainage from Left hip wound Objective: Vital Signs: Blood pressure 106/66, pulse 81, temperature 98.9 F (37.2 C), temperature source Oral, resp. rate 18, weight 97.5 kg (214 lb 15.2 oz), SpO2 93 %. No results found. No results found for this or any previous visit (from the past 72 hour(s)).   Lungs clear Cor RRR Abd soft NT Neuro: Alert/Oriented, Normal Sensory and Abnormal Motor trace left hip flexio and 3- knee ext (pain limited), 3/5 L ADF/PF, 5/5 in BUE and RLE Musc/Skel:  Swelling Left thigh, medial ecchymosis Skin- Left hip sup aspect of incision with mild erythema, sm amt drainage Gen NAD   Assessment/Plan: 1. Functional deficits secondary to Left acetabular fracture TDWB until clear  which require 3+ hours per day of interdisciplinary therapy in a comprehensive inpatient rehab setting. Stable for D/C today F/u PCP in 1-2 weeks F/u ortho weeks See D/C summary See D/C instructions  FIM: FIM - Bathing Bathing Steps Patient Completed: Chest, Right Arm, Left Arm, Abdomen, Front perineal area, Buttocks, Right upper leg, Left upper leg, Right lower leg (including foot), Left lower leg (including foot) Bathing: 6: Assistive device (Comment)  FIM - Upper  Body Dressing/Undressing Upper body dressing/undressing steps patient completed: Thread/unthread right sleeve of pullover shirt/dresss, Pull shirt over trunk, Thread/unthread left sleeve of pullover shirt/dress, Put head through opening of pull over shirt/dress Upper body dressing/undressing: 7: Complete Independence: No helper FIM - Lower Body Dressing/Undressing Lower body dressing/undressing steps patient completed: Thread/unthread right pants leg, Pull pants up/down, Don/Doff right sock, Don/Doff left sock, Thread/unthread left pants leg, Don/Doff right shoe, Don/Doff left shoe Lower body dressing/undressing: 6: Assistive device (Comment)  FIM - Toileting Toileting steps completed by patient: Adjust clothing prior to toileting, Performs perineal hygiene, Adjust clothing after toileting Toileting Assistive Devices: Grab bar or rail for support Toileting: 6: Assistive device: No helper  FIM - Diplomatic Services operational officerToilet Transfers Toilet Transfers Assistive Devices: Engineer, materialsGrab bars, Art gallery managerWalker Toilet Transfers: 6-Assistive device: No helper, 6-To toilet/ BSC, 6-From toilet/BSC  FIM - BankerBed/Chair Transfer Bed/Chair Transfer Assistive Devices: Walker, Arm rests (leg lifter) Bed/Chair Transfer: 6: Supine > Sit: No assist, 6: Sit > Supine: No assist, 6: Chair or W/C > Bed: No assist, 6: Bed > Chair or W/C: No assist  FIM - Locomotion: Wheelchair Distance: 200 Locomotion: Wheelchair: 6: Travels 150 ft or more, turns around, maneuvers to table, bed or toilet, negotiates 3% grade: maneuvers on rugs and over door sills independently FIM - Locomotion: Ambulation Locomotion: Ambulation Assistive Devices: Designer, industrial/productWalker - Rolling Ambulation/Gait Assistance: 6: Modified independent (Device/Increase time) Locomotion: Ambulation: 5: Household Independent - travels 50 - 149 ft independent or modified independent  Comprehension Comprehension Mode: Auditory Comprehension: 7-Follows complex conversation/direction: With no  assist  Expression Expression Mode: Verbal Expression: 7-Expresses complex ideas: With no  assist  Social Interaction Social Interaction: 6-Interacts appropriately with others with medication or extra time (anti-anxiety, antidepressant).  Problem Solving Problem Solving: 5-Solves complex 90% of the time/cues < 10% of the time  Memory Memory: 6-More than reasonable amt of time  Medical Problem List and Plan: 1. Functional deficits secondary to Left acetabular fracture--TTWB X 8 weeks.  2. DVT Prophylaxis/Anticoagulation: Pharmaceutical: Lovenox D# 15 followed by ASA 325 mg bid. Pt refused to be taught self administration, will discuss with ortho 3. Pain Management:MS contin discontinued .Continue oxycodone 15 mg prn. further weaning as per orthopedics 4. Mood: LCSW to follow for evaluation and support.  5. Neuropsych: This patient is capable of making decisions on his own behalf. 6. Skin/Wound Care: start doxy for skin 7. Fluids/Electrolytes/Nutrition: Monitor I/O. Offer supplements as needed. Check lytes in am. 8. ABLA: Will continue to monitor. Recheck CBC in am.  9. Scrotal trauma/edema:Doing better, when necessary ice 10. Polysubstance abuse: Patient doesn't perceive any problems with his substance use  11. Post op ileus resolved LOS (Days) 9 A FACE TO FACE EVALUATION WAS PERFORMED  KIRSTEINS,ANDREW E 08/28/2014, 9:56 AM

## 2014-08-28 NOTE — Progress Notes (Signed)
Social Work Discharge Note  The overall goal for the admission was met for:   Discharge location: Yes - home with his mother; sister to help also  Length of Stay: Yes - 9 days  Discharge activity level: Yes - modified independent with w/c  Home/community participation: Yes   Services provided included: MD, RD, PT, OT, RN, TR, Pharmacy and SW  Financial Services: Private Insurance: [place] [employer]  Follow-up services arranged: Home Health: RN/PT from [place], DME: w/c with elevating leg rest; rolling walker; tub bench from [place] and Patient/Family has no preference for HH/DME agencies  Comments (or additional information):  Pt is going to his mother's home at d/c.  His sister drove him home at d/c and will help as needed.  Patient/Family verbalized understanding of follow-up arrangements: Yes  Individual responsible for coordination of the follow-up plan: pt  Confirmed correct DME delivered: Trey Sailors 08/28/2014    Algernon Mundie, Silvestre Mesi

## 2014-08-28 NOTE — Progress Notes (Signed)
Patient discharged home.  Left floor via wheelchair, escorted by nursing staff and family.  All patient belongings sent with patient including DME.  Patient and family verbalized understanding of discharge instructions as given by Dan AngiulDeatra Inali, PA.  Appears in no immediate distress at this time.  Dani Gobbleeardon, Virgil Slinger J, RN

## 2014-08-28 NOTE — Progress Notes (Signed)
Recreational Therapy Discharge Summary Patient Details  Name: [name] MRN: [id number] Date of Birth: 02/18/1976 Today's Date: 08/28/2014  Long term goals set: 2  Long term goals met: 2  Comments on progress toward goals: Pt has made great progress toward goals meeting 2/2.  Pt is discharging home today at overall mod I level for leisure pursuits as well as community reintegration w/c level. Reasons for discharge: discharge from hospital  Patient/family agrees with progress made and goals achieved: Yes  [name] 08/28/2014, 8:32 AM

## 2014-09-06 DIAGNOSIS — N5089 Other specified disorders of the male genital organs: Secondary | ICD-10-CM | POA: Diagnosis present

## 2014-09-06 DIAGNOSIS — K567 Ileus, unspecified: Secondary | ICD-10-CM | POA: Diagnosis present

## 2014-09-06 NOTE — Discharge Summary (Signed)
Physician Discharge Summary  Patient ID: [name] MRN: [id number] DOB/AGE: [birth year] 39 y.o.  Admit date: 08/19/2014 Discharge date: 08/28/2014  Discharge Diagnoses:  Active Problems:   Acute blood loss anemia   Vitamin D insufficiency   Acetabular fracture   Scrotal edema   Ileus   Discharged Condition: Stable   Significant Diagnostic Studies: Dg Abd 1 View  08/21/2014   CLINICAL DATA:  Abdominal pain and distention.  EXAM: ABDOMEN - 1 VIEW  COMPARISON:  08/19/2014  FINDINGS: There is air throughout small and large bowel. There are a few short air-fluid levels in the right hemicolon which likely reflects liquid stool. There is no evidence of bowel obstruction or perforation.  IMPRESSION: Negative for obstruction or perforation. Colonic air-fluid levels likely reflect liquid stool.   Electronically Signed   By: Ellery Plunkaniel R Mitchell M.D.   On: 08/21/2014 06:54   Dg Abd 1 View  08/19/2014   CLINICAL DATA:  Abdominal pain and distention 6 days after hip surgery.  EXAM: ABDOMEN - 1 VIEW  COMPARISON:  08/11/2014 abdominal CT  FINDINGS: Colonic distention with fluid levels. No visible stool. No dilated small bowel. Partial imaging of the upper abdomen due to patient condition; no evidence of pneumoperitoneum.  Changes of recent left pelvic fixation.  IMPRESSION: Colonic distention with fluid levels which could reflect a diarrheal illness or ileus.   Electronically Signed   By: Marnee SpringJonathon  Watts M.D.   On: 08/19/2014 21:28    Labs:  Basic Metabolic Panel: BMP Latest Ref Rng 08/21/2014 08/20/2014 08/15/2014  Glucose 70 - 99 mg/dL 956(O113(H) 130(Q110(H) 657(Q120(H)  BUN 6 - 20 mg/dL 12 11 8   Creatinine 0.61 - 1.24 mg/dL 4.690.82 6.290.83 5.281.10  Sodium 135 - 145 mmol/L 131(L) 132(L) 131(L)  Potassium 3.5 - 5.1 mmol/L 4.0 4.0 3.5  Chloride 101 - 111 mmol/L 95(L) 96(L) 97  CO2 22 - 32 mmol/L 27 26 26   Calcium 8.9 - 10.3 mg/dL 4.1(L8.8(L) 2.4(M8.7(L) 0.1(U8.0(L)     CBC: CBC Latest Ref Rng 08/20/2014 08/18/2014 08/17/2014  WBC 4.0 - 10.5  K/uL 6.6 6.2 5.8  Hemoglobin 13.0 - 17.0 g/dL 8.1(L) 8.2(L) 7.7(L)  Hematocrit 39.0 - 52.0 % 25.4(L) 25.0(L) 22.4(L)  Platelets 150 - 400 K/uL 294 294 237     CBG: No results for input(s): GLUCAP in the last 168 hours.  Brief HPI:   [name] is a 39 y.o. male who was admitted on 08/11/14 after being thrown off his horse with subsequent everely comminuted both column acetabulum fracture. UDS positive for cocaine and THC. He was placed in traction at admission and underwent ORIF left both column acetabular fracture with removal of tibial pin by Dr. [name] on 08/11/14.  Post op TDWB LLE X 12 weeks with recommendations for lovenox X 20 days followed by ASA bid. Post op has had issues with  ABLA, scrotal edema as well as abdominal discomfort due to constipation. Therapy initiated and patient was showing improvement in activity tolerance. CIR was recommended for follow up therapy.    Hospital Course: [name] was admitted to rehab 08/19/2014 for inpatient therapies to consist of PT, ST and OT at least three hours five days a week. Past admission physiatrist, therapy team and rehab RN have worked together to provide customized collaborative inpatient rehab. Patient continued to have issues with abdominal distension with constipation. KUB was done revealing ileus and diet was downgraded to full liquids with addition of reglan.  Bowel program was augmented with improvement in  symptoms. Follow up KUB showed improvement and diet was slowly advanced to regular and reglan was tapered off. Hip incision has been monitored and is healing well without signs/symptoms of infection. Scrotal edema has improved with use of elevation as well as prn use of ice. He was voiding without difficulty. ABLA is stable and he is to continue on iron supplement past discharge.  Pain control has improved and MS Contin was weaned off during his rehab stay. He was maintained on lovenox for DVT prophylaxis but refused to learn self  administration for use past discharge. He was discharged with recommendations to use ASA 325 mg bid for DVT prophylaxis till cleared by Ortho. He made good gains during his rehab stay and was modified independent at discharge. He will continue to receive follow up HHPTand HHRN by [facility] past discharge.    Rehab course: During patient's stay in rehab weekly team conferences were held to monitor patient's progress, set goals and discuss barriers to discharge. Patient has had improvement in activity tolerance, balance, postural control, as well as ability to compensate for deficits. He is independent for transfers and is ambulating short distances at modified independent level.  He is able to complete ADL tasks with use of AE at modified independent level with more than reasonable amount of time. Family education was done with mother and sister who will provide intermittent assistance past discharge.    Disposition: 01-Home or Self Care  Diet: Regular  Special Instructions: 1. Touch down weight on LLE.     Medication List    STOP taking these medications        benzocaine 10 % mucosal gel  Commonly known as:  ORAJEL     Vitamin D (Ergocalciferol) 50000 UNITS Caps capsule  Commonly known as:  DRISDOL      TAKE these medications        ascorbic acid 500 MG tablet  Commonly known as:  VITAMIN C  Take 1 tablet (500 mg total) by mouth 2 (two) times daily.     aspirin 325 MG EC tablet  Take 1 tablet (325 mg total) by mouth 2 (two) times daily.     Cholecalciferol 1000 UNITS tablet  Take 1 tablet (1,000 Units total) by mouth 2 (two) times daily.     doxycycline 100 MG tablet  Commonly known as:  VIBRA-TABS  Take 1 tablet (100 mg total) by mouth every 12 (twelve) hours.     gabapentin 300 MG capsule  Commonly known as:  NEURONTIN  Take 1 capsule (300 mg total) by mouth 2 (two) times daily.     iron polysaccharides 150 MG capsule  Commonly known as:  NIFEREX  Take 1  capsule (150 mg total) by mouth daily.     methocarbamol 500 MG tablet  Commonly known as:  ROBAXIN  Take 2 tablets (1,000 mg total) by mouth every 6 (six) hours as needed for muscle spasms.     oxyCODONE 15 MG immediate release tablet--Rx # 90 pills   Commonly known as:  ROXICODONE  Take 1 tablet (15 mg total) by mouth every 4 (four) hours as needed for severe pain.     polyethylene glycol packet  Commonly known as:  MIRALAX / GLYCOLAX  Take 17 g by mouth 2 (two) times daily.       Follow-up Information    Call Erick ColaceKIRSTEINS,ANDREW E, MD.   Specialty:  Physical Medicine and Rehabilitation   Why:  As needed   Contact information:  [address] [place] [address] [phone]       Follow up with Budd Palmer, MD. Schedule an appointment as soon as possible for a visit today.   Specialty:  Orthopedic Surgery   Contact information:   [address] [place] [address] [phone]       Signed: Jacquelynn Cree 09/06/2014, 5:59 PM

## 2015-04-18 LAB — TESTOSTERONE: Testosterone: 98 ng/dL — ABNORMAL LOW (ref 300–890)

## 2016-07-29 IMAGING — CT CT HEAD W/O CM
3 of 6 series · 13 of 47 positions shown, 15 images · non-contrast
Comparison: None.

CLINICAL DATA: 38-year-old male status post fall while riding horse
with horse then rolling on top of him.

EXAM:
CT HEAD WITHOUT CONTRAST
CT CERVICAL SPINE WITHOUT CONTRAST
TECHNIQUE: Multidetector CT imaging of the head and cervical spine was
performed following the standard protocol without intravenous
contrast. Multiplanar CT image reconstructions of the cervical spine
were also generated.

[Series 7: coronals · coronal · 0.24mm/px · 3 of 61 slices shown]
[im 21/61  brain]
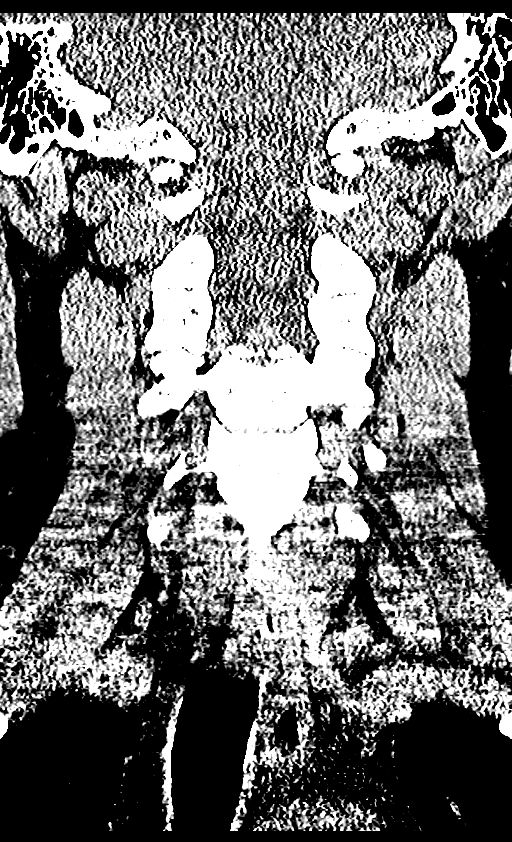
[im 27/61  brain]
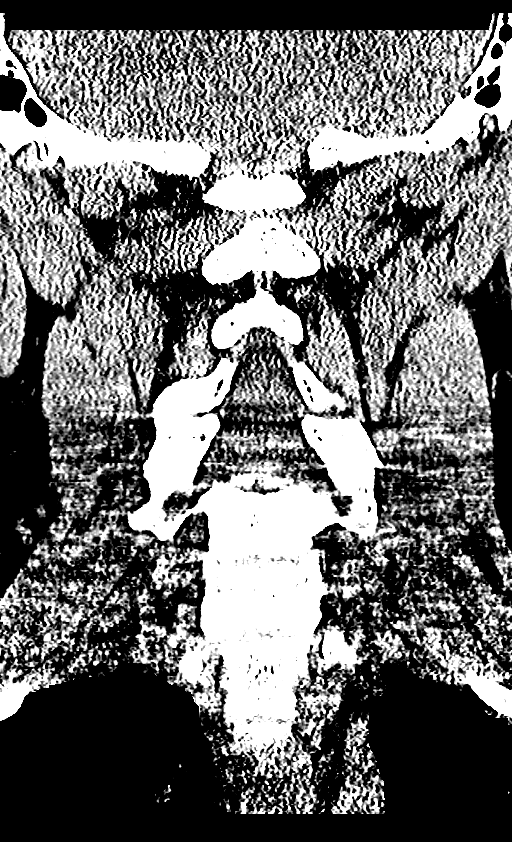
[im 34/61  brain]
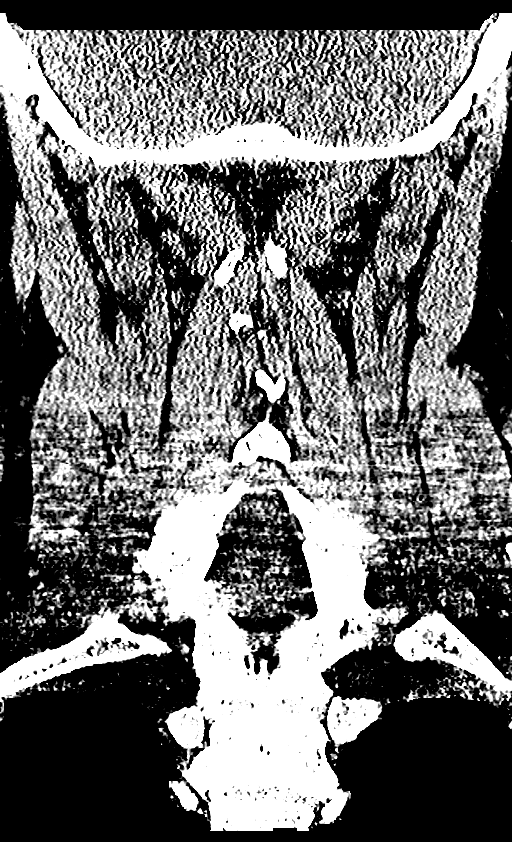

[Series 8: sagittals · sagittal · 0.30mm/px · 3 of 53 slices shown]
[im 18/53  brain]
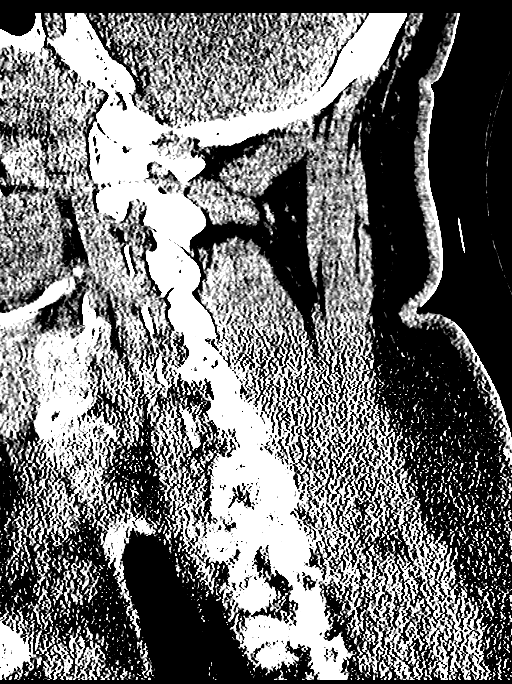
[im 27/53  brain]
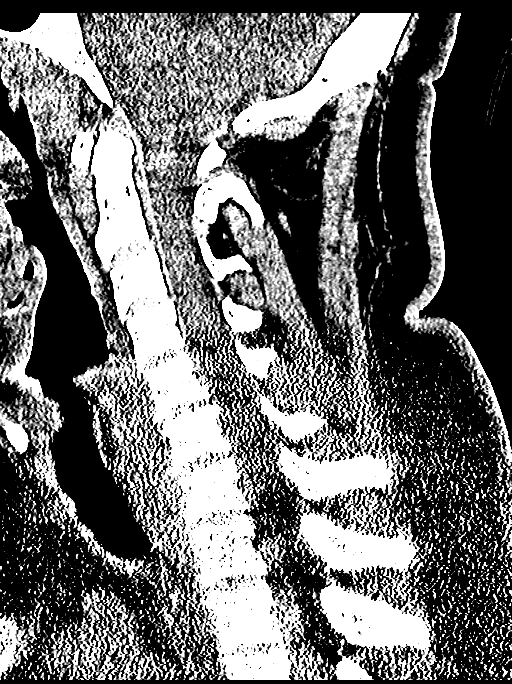
[im 35/53  brain]
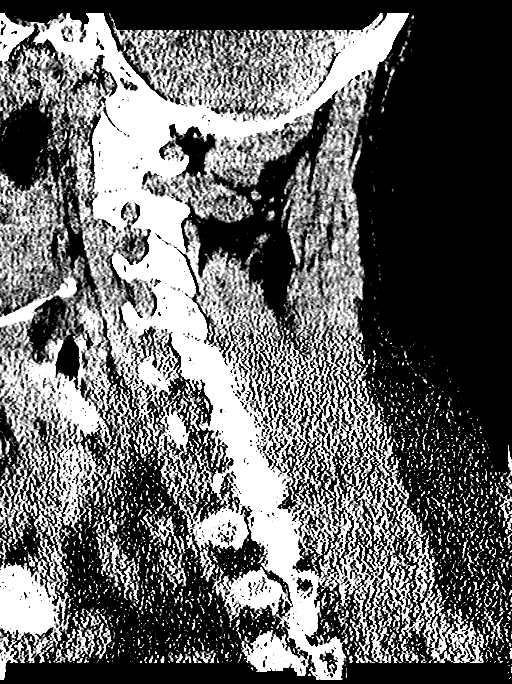

[Series 9: orthogonals · axial · 0.23mm/px · z∈[-308,-166]mm · 7 of 98 slices shown, 9 images]
[im 13/98  brain]
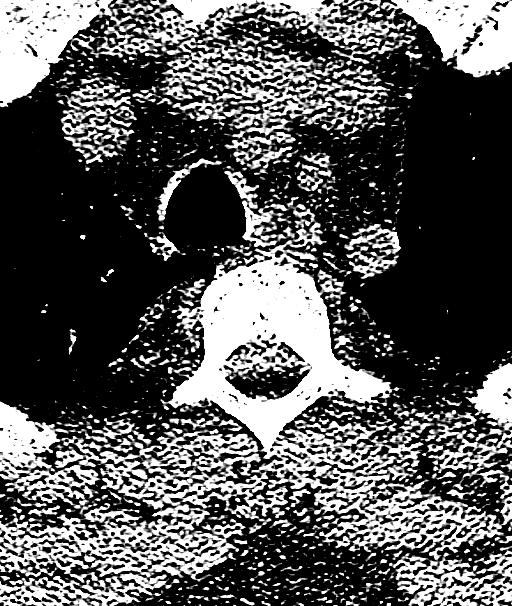
[im 13/98  bone]
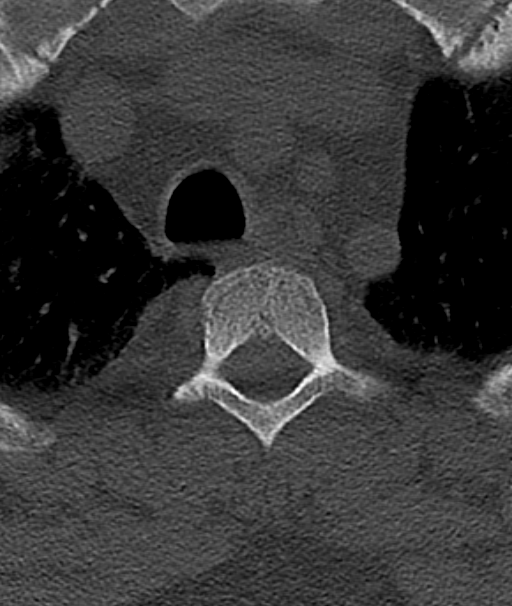
[im 25/98  brain]
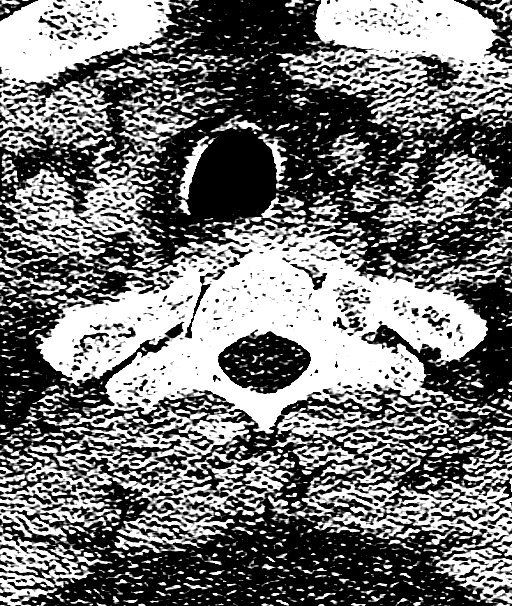
[im 37/98  brain]
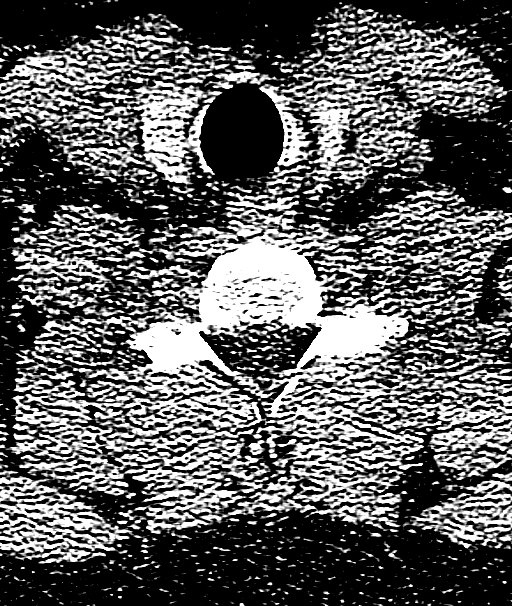
[im 49/98  brain]
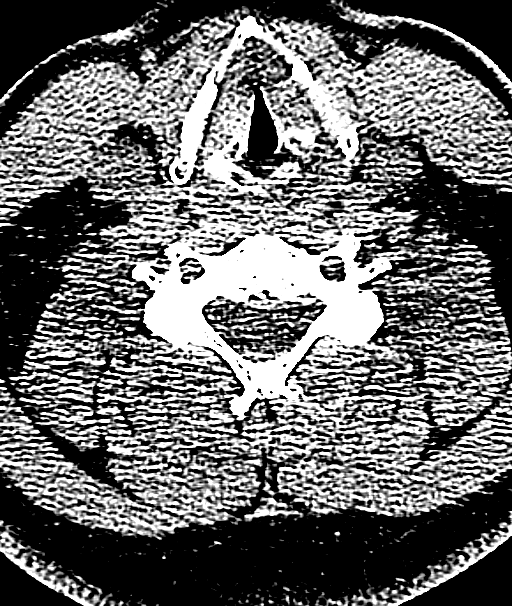
[im 61/98  brain]
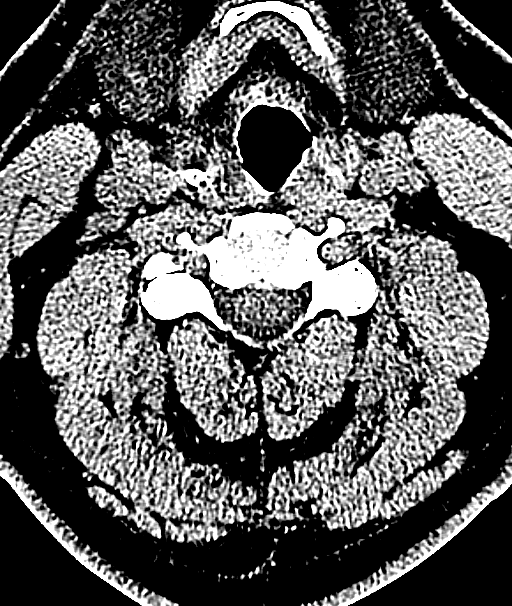
[im 61/98  bone]
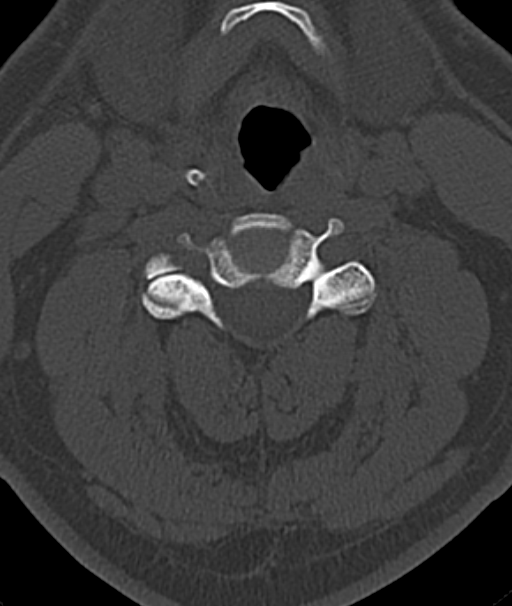
[im 73/98  brain]
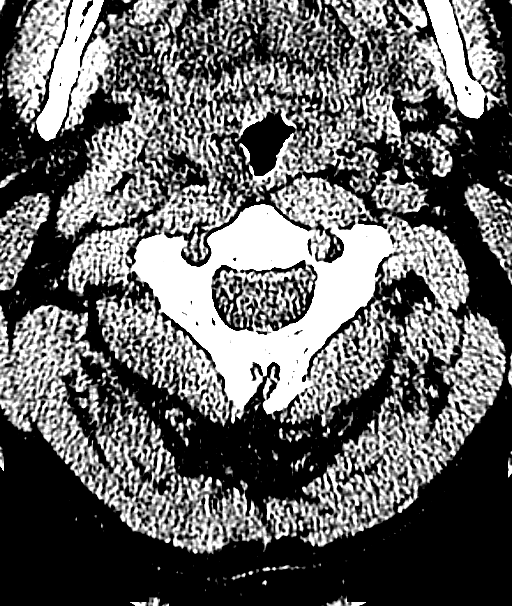
[im 85/98  brain]
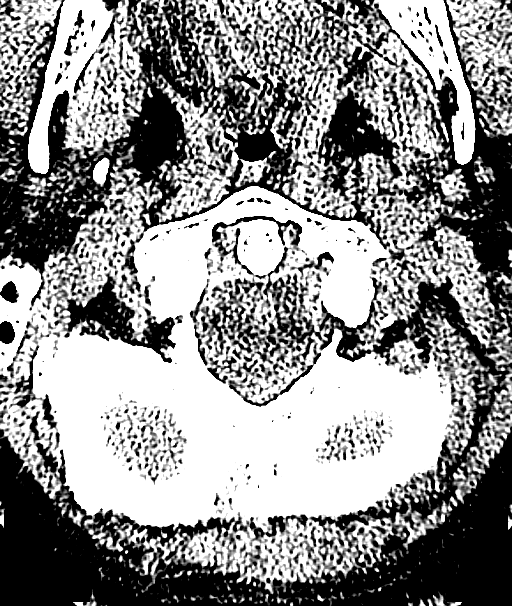

[13 of 47 positions shown; findings below may reference images not displayed]

FINDINGS: CT HEAD FINDINGS

Negative for acute intracranial hemorrhage, acute infarction, mass,
mass effect, hydrocephalus or midline shift. Gray-white
differentiation is preserved throughout. No focal soft tissue or
calvarial abnormality. Mucoperiosteal thickening noted in the right
maxillary sinus. Otherwise, the mastoid air cells and visualized
paranasal sinuses are within normal limits. Globes and orbits are
unremarkable.

CT CERVICAL SPINE FINDINGS

No acute fracture, malalignment or prevertebral soft tissue
swelling. Unremarkable CT appearance of the thyroid gland. No acute
soft tissue abnormality. The lung apices are unremarkable.
IMPRESSION: CT HEAD

1. Negative
2. Mild and chronic appearing right maxillary inflammatory sinus
disease.
CT CSPINE

1. No acute fracture or malalignment.
